# Patient Record
Sex: Male | Born: 1960 | Race: Black or African American | Hispanic: No | Smoking: Former smoker
Health system: Southern US, Community
[De-identification: ages and names within clinical notes are randomized; demographics above are authoritative.]

## PROBLEM LIST (undated history)

## (undated) DIAGNOSIS — I10 Essential (primary) hypertension: Secondary | ICD-10-CM

## (undated) DIAGNOSIS — T7840XA Allergy, unspecified, initial encounter: Secondary | ICD-10-CM

## (undated) HISTORY — DX: Essential (primary) hypertension: I10

## (undated) HISTORY — DX: Allergy, unspecified, initial encounter: T78.40XA

## (undated) HISTORY — PX: INCISION AND DRAINAGE PERITONSILLAR ABSCESS: SUR670

## (undated) HISTORY — PX: KNEE ARTHROSCOPY: SHX127

---

## 2010-03-02 ENCOUNTER — Ambulatory Visit: Payer: Self-pay | Admitting: Gastroenterology

## 2010-05-23 ENCOUNTER — Emergency Department: Payer: Self-pay | Admitting: Emergency Medicine

## 2010-06-23 ENCOUNTER — Ambulatory Visit: Payer: Self-pay | Admitting: Unknown Physician Specialty

## 2010-07-02 ENCOUNTER — Ambulatory Visit: Payer: Self-pay | Admitting: Unknown Physician Specialty

## 2012-08-06 ENCOUNTER — Observation Stay: Payer: Self-pay | Admitting: Otolaryngology

## 2012-08-06 LAB — CBC WITH DIFFERENTIAL/PLATELET
Comment - H1-Com1: NORMAL
Comment - H1-Com2: NORMAL
HCT: 48.7 % (ref 40.0–52.0)
HGB: 15.8 g/dL (ref 13.0–18.0)
Lymphocytes: 15 %
MCHC: 32.5 g/dL (ref 32.0–36.0)
Monocytes: 6 %
RBC: 5.62 10*6/uL (ref 4.40–5.90)
RDW: 14.1 % (ref 11.5–14.5)
WBC: 17.4 10*3/uL — ABNORMAL HIGH (ref 3.8–10.6)

## 2012-08-06 LAB — BASIC METABOLIC PANEL
Anion Gap: 6 — ABNORMAL LOW (ref 7–16)
BUN: 9 mg/dL (ref 7–18)
Calcium, Total: 9.4 mg/dL (ref 8.5–10.1)
EGFR (African American): >60
EGFR (Non-African Amer.): >60
Glucose: 100 mg/dL — ABNORMAL HIGH (ref 65–99)
Osmolality: 271 (ref 275–301)

## 2012-08-13 LAB — WOUND CULTURE

## 2015-04-08 NOTE — Op Note (Signed)
PATIENT NAME:  Terry Pearson, Terry Pearson MR#:  [id number] DATE OF BIRTH:  04/05/1961  DATE OF PROCEDURE:  08/06/2012  PREOPERATIVE DIAGNOSIS: Right peritonsillar abscess.   POSTOPERATIVE DIAGNOSIS: Right peritonsillar abscess.   OPERATIVE PROCEDURE: Incision and drainage right peritonsillar abscess.   SURGEON: Cammy CopaPaul H. Jackey Housey, MD  ANESTHESIA: General.   COMPLICATIONS: None.   TOTAL ESTIMATED BLOOD LOSS: 30 mL.   DESCRIPTION OF PROCEDURE: Patient given general anesthesia by oral endotracheal intubation. A Davis mouth gag was used to visualize the oropharynx. The uvula had watery edema and was pushed over to the left side. The left tonsil was enlarged. There is a little bit of scab superiorly on the tonsil and when we removed this you could see some pus that was slowly starting to leak through this area. An incision was made in the palate superior and laterally to the tonsil. This was cut with cautery. Hemostat was used to open up the peritonsillar area and a superior abscess was found. This was all suctioned clear. It was first cultured for aerobic and anaerobic bacteria. A piece of gauze was then stuffed in the hole. This helped to control bleeding. There is no significant oozing. The patient tolerated procedure well. He was awakened, taken to the recovery room in satisfactory condition. There were no operative complications.   ____________________________ Cammy CopaPaul H. Gustav Knueppel, MD phj:cms D: 08/06/2012 19:24:36 ET T: 08/07/2012 09:26:19 ET JOB#: [id number]  cc: Cammy CopaPaul H. Vineeth Fell, MD, <Dictator> Cammy CopaPAUL H Frankye Schwegel MD ELECTRONICALLY SIGNED 08/10/2012 8:28

## 2015-07-08 ENCOUNTER — Encounter: Payer: Self-pay | Admitting: Family Medicine

## 2015-07-08 ENCOUNTER — Ambulatory Visit (INDEPENDENT_AMBULATORY_CARE_PROVIDER_SITE_OTHER): Payer: Managed Care, Other (non HMO) | Admitting: Family Medicine

## 2015-07-08 VITALS — BP 138/72 | HR 87 | Temp 97.6°F | Resp 18 | Ht 72.0 in | Wt 191.5 lb

## 2015-07-08 DIAGNOSIS — M5416 Radiculopathy, lumbar region: Secondary | ICD-10-CM

## 2015-07-08 DIAGNOSIS — IMO0001 Reserved for inherently not codable concepts without codable children: Secondary | ICD-10-CM

## 2015-07-08 DIAGNOSIS — M5442 Lumbago with sciatica, left side: Secondary | ICD-10-CM

## 2015-07-08 DIAGNOSIS — G8929 Other chronic pain: Secondary | ICD-10-CM | POA: Insufficient documentation

## 2015-07-08 MED ORDER — TIZANIDINE HCL 2 MG PO TABS: 2.0000 mg | ORAL_TABLET | Freq: Three times a day (TID) | ORAL | Status: DC | PRN

## 2015-07-08 MED ORDER — NAPROXEN 500 MG PO TABS: 500.0000 mg | ORAL_TABLET | Freq: Two times a day (BID) | ORAL | Status: DC

## 2015-07-08 NOTE — Progress Notes (Signed)
Name: Terry Pearson   MRN: [id number]    DOB: 12-Jan-1961   Date:07/08/2015       Progress Note  Subjective  Chief Complaint  Chief Complaint  Patient presents with  . Establish Care    NP (Dr. Elsie StainMoffeit)  . Back Pain    Lower     Back Pain This is a recurrent problem. The current episode started 1 to 4 weeks ago. The problem occurs constantly. The problem has been gradually worsening since onset. The pain is present in the lumbar spine and gluteal. The pain radiates to the right knee and right thigh. The pain is at a severity of 7/10. The pain is moderate. The symptoms are aggravated by position and sitting (worse with sitting continuously for driving. 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 Stiffness is present in the morning and all day. Associated symptoms include leg pain and tingling (tingling in his R. thigh sometimes.). Pertinent negatives include no abdominal pain, bladder incontinence, bowel incontinence, chest pain, dysuria, numbness, paresis or paresthesias. He has tried NSAIDs and muscle relaxant for the symptoms. The treatment provided moderate relief.      Past Medical History  Diagnosis Date  . Allergy     Past Surgical History  Procedure Laterality Date  . Knee surgery Left 2011  . Throat surgery  2013    to remove a cyst    Family History  Problem Relation Age of Onset  . Hypertension Mother   . Cancer Mother     breast  . Hypertension Father   . Diabetes Father   . Cancer Sister     Pancreatic Cancer.    History   Social History  . Marital Status: Married    Spouse Name: N/A  . Number of Children: N/A  . Years of Education: N/A   Occupational History  . Not on file.   Social History Main Topics  . Smoking status: Former Smoker -- 13 years  . Smokeless tobacco: Never Used  . Alcohol Use: 0.6 oz/week    1 Standard drinks or equivalent per week  . Drug Use: No  . Sexual Activity: Not on file   Other Topics Concern  . Not on file   Social  History Narrative  . No narrative on file     Current outpatient prescriptions:  Marland Kitchen.  MULTIPLE VITAMIN PO, Take 1 tablet by mouth daily., Disp: , Rfl:   No Known Allergies   Review of Systems  Cardiovascular: Negative for chest pain.  Gastrointestinal: Negative for abdominal pain and bowel incontinence.  Genitourinary: Negative for bladder incontinence and dysuria.  Musculoskeletal: Positive for back pain.  Neurological: Positive for tingling (tingling in his R. thigh sometimes.). Negative for numbness and paresthesias.      Objective  Filed Vitals:   07/08/15 1151  BP: 138/72  Pulse: 87  Temp: 97.6 F (36.4 C)  TempSrc: Oral  Resp: 18  Height: 6' (1.829 m)  Weight: 191 lb 8 oz (86.864 kg)  SpO2: 94%    Physical Exam  Constitutional: He is well-developed, well-nourished, and in no distress.  Musculoskeletal:       Lumbar back: He exhibits decreased range of motion, tenderness, pain and spasm.       Back:  Neurological: He has an abnormal Straight Leg Raise Test (pain in lower back and posterior R leg.).  Reflex Scores:      Patellar reflexes are 2+ on the right side. Nursing note and vitals reviewed.  Assessment & Plan 1. Radicular pain of right lower back Started patient on muscle relaxer and NSAID therapy and we'll order an MRI of lumbar spine for evaluation of progressive lumbar radiculopathy. - naproxen (NAPROSYN) 500 MG tablet; Take 1 tablet (500 mg total) by mouth 2 (two) times daily with a meal.  Dispense: 30 tablet; Refill: 0 - tiZANidine (ZANAFLEX) 2 MG tablet; Take 1 tablet (2 mg total) by mouth every 8 (eight) hours as needed for muscle spasms.  Dispense: 30 tablet; Refill: 0 - MR Lumbar Spine Wo Contrast; Future    Deirdra Heumann Asad A[facility] 07/08/2015 12:13 PM

## 2015-07-15 ENCOUNTER — Ambulatory Visit: Payer: Self-pay | Admitting: Family Medicine

## 2015-07-23 ENCOUNTER — Ambulatory Visit: Payer: Managed Care, Other (non HMO) | Admitting: Family Medicine

## 2015-10-21 ENCOUNTER — Ambulatory Visit (INDEPENDENT_AMBULATORY_CARE_PROVIDER_SITE_OTHER): Payer: Managed Care, Other (non HMO) | Admitting: Family Medicine

## 2015-10-21 ENCOUNTER — Encounter: Payer: Self-pay | Admitting: Family Medicine

## 2015-10-21 VITALS — BP 164/110 | HR 91 | Temp 97.6°F | Resp 16 | Ht 72.0 in | Wt 188.9 lb

## 2015-10-21 DIAGNOSIS — I10 Essential (primary) hypertension: Secondary | ICD-10-CM | POA: Diagnosis not present

## 2015-10-21 DIAGNOSIS — M545 Low back pain, unspecified: Secondary | ICD-10-CM

## 2015-10-21 DIAGNOSIS — G8929 Other chronic pain: Secondary | ICD-10-CM | POA: Diagnosis not present

## 2015-10-21 MED ORDER — TRAMADOL HCL 50 MG PO TABS: 50.0000 mg | ORAL_TABLET | Freq: Four times a day (QID) | ORAL | Status: DC | PRN

## 2015-10-21 MED ORDER — TIZANIDINE HCL 2 MG PO TABS: 2.0000 mg | ORAL_TABLET | Freq: Three times a day (TID) | ORAL | Status: DC | PRN

## 2015-10-21 MED ORDER — LISINOPRIL-HYDROCHLOROTHIAZIDE 10-12.5 MG PO TABS: 1.0000 | ORAL_TABLET | Freq: Every day | ORAL | Status: DC

## 2015-10-21 NOTE — Progress Notes (Signed)
Name: Terry Pearson   MRN: [id number]    DOB: 05-Feb-1961   Date:10/21/2015       Progress Note  Subjective  Chief Complaint  Chief Complaint  Patient presents with  . Back Pain    low back pain    Back Pain This is a recurrent problem. The problem is unchanged. The pain is present in the lumbar spine. The quality of the pain is described as aching. The pain does not radiate. The pain is at a severity of 5/10. The pain is mild. The symptoms are aggravated by sitting and standing (standing for long periods.). Pertinent negatives include no bladder incontinence, bowel incontinence, chest pain, fever, headaches, numbness, tingling or weakness. He has tried NSAIDs and muscle relaxant (Taking Naproxena dn Tizanidine Rx at last OV intermittently.) for the symptoms.    Past Medical History  Diagnosis Date  . Allergy   . Hypertension, poor control 10/21/2015    Past Surgical History  Procedure Laterality Date  . Knee surgery Left 2011  . Throat surgery  2013    to remove a cyst    Family History  Problem Relation Age of Onset  . Hypertension Mother   . Cancer Mother     breast  . Hypertension Father   . Diabetes Father   . Cancer Sister     Pancreatic Cancer.    Social History   Social History  . Marital Status: Married    Spouse Name: N/A  . Number of Children: N/A  . Years of Education: N/A   Occupational History  . Not on file.   Social History Main Topics  . Smoking status: Former Smoker -- 13 years  . Smokeless tobacco: Never Used  . Alcohol Use: 0.6 oz/week    1 Standard drinks or equivalent per week  . Drug Use: No  . Sexual Activity: Not on file   Other Topics Concern  . Not on file   Social History Narrative    Current outpatient prescriptions:  .  lisinopril-hydrochlorothiazide (PRINZIDE,ZESTORETIC) 10-12.5 MG tablet, Take 1 tablet by mouth daily., Disp: 90 tablet, Rfl: 0 .  MULTIPLE VITAMIN PO, Take 1 tablet by mouth daily., Disp: , Rfl:  .   tiZANidine (ZANAFLEX) 2 MG tablet, Take 1 tablet (2 mg total) by mouth every 8 (eight) hours as needed for muscle spasms., Disp: 30 tablet, Rfl: 0 .  traMADol (ULTRAM) 50 MG tablet, Take 1 tablet (50 mg total) by mouth every 6 (six) hours as needed., Disp: 60 tablet, Rfl: 0  Allergies  Allergen Reactions  . Tramadol Other (See Comments)    Made him feel like he was drunk.    Review of Systems  Constitutional: Negative for fever and chills.  Eyes: Negative for blurred vision.  Respiratory: Negative for shortness of breath.   Cardiovascular: Negative for chest pain.  Gastrointestinal: Negative for bowel incontinence.  Genitourinary: Negative for bladder incontinence.  Musculoskeletal: Positive for back pain.  Neurological: Negative for tingling, weakness, numbness and headaches.   Objective  Filed Vitals:   10/21/15 1357 10/21/15 1436  BP: 164/100 164/110  Pulse: 91   Temp: 97.6 F (36.4 C)   TempSrc: Oral   Resp: 16   Height: 6' (1.829 m)   Weight: 188 lb 14.4 oz (85.684 kg)   SpO2: 95%     Physical Exam  Constitutional: He is oriented to person, place, and time and well-developed, well-nourished, and in no distress.  Cardiovascular: Normal rate, regular rhythm and normal  heart sounds.   No murmur heard. Pulmonary/Chest: Effort normal and breath sounds normal. He has no wheezes. He has no rales.  Musculoskeletal:       Lumbar back: He exhibits pain and spasm. He exhibits normal range of motion and no tenderness.       Back:  Neurological: He is alert and oriented to person, place, and time.  Nursing note and vitals reviewed.   Assessment & Plan  1. Chronic bilateral low back pain without sciatica He has taken naproxen in the past. We will start on tramadol and muscle relaxant therapy and we'll obtain an x-ray for evaluation. - tiZANidine (ZANAFLEX) 2 MG tablet; Take 1 tablet (2 mg total) by mouth every 8 (eight) hours as needed for muscle spasms.  Dispense: 30  tablet; Refill: 0 - traMADol (ULTRAM) 50 MG tablet; Take 1 tablet (50 mg total) by mouth every 6 (six) hours as needed.  Dispense: 60 tablet; Refill: 0 - DG Lumbar Spine Complete; Future  2. Hypertension, poor control Or pressure elevated on manual repeat. We'll start on lisinopril-HCTZ. Patient will follow-up in 4 weeks for repeat assessment - lisinopril-hydrochlorothiazide (PRINZIDE,ZESTORETIC) 10-12.5 MG tablet; Take 1 tablet by mouth daily.  Dispense: 90 tablet; Refill: 0    Terry Pearson [facility] 10/21/2015 7:29 PM

## 2015-11-07 ENCOUNTER — Ambulatory Visit: Payer: Managed Care, Other (non HMO) | Admitting: Family Medicine

## 2015-11-10 ENCOUNTER — Ambulatory Visit (INDEPENDENT_AMBULATORY_CARE_PROVIDER_SITE_OTHER): Payer: Managed Care, Other (non HMO) | Admitting: Family Medicine

## 2015-11-10 ENCOUNTER — Encounter: Payer: Self-pay | Admitting: Family Medicine

## 2015-11-10 VITALS — BP 162/89 | HR 104 | Temp 98.7°F | Resp 18 | Ht 72.0 in | Wt 186.2 lb

## 2015-11-10 DIAGNOSIS — I10 Essential (primary) hypertension: Secondary | ICD-10-CM | POA: Diagnosis not present

## 2015-11-10 MED ORDER — LISINOPRIL-HYDROCHLOROTHIAZIDE 20-25 MG PO TABS: 1.0000 | ORAL_TABLET | Freq: Every day | ORAL | Status: DC

## 2015-11-10 NOTE — Progress Notes (Signed)
Name: Terry Pearson   MRN: [id number]    DOB: 1961/10/19   Date:11/10/2015       Progress Note  Subjective  Chief Complaint  Chief Complaint  Patient presents with  . Follow-up    2 wk back pain  . Hypertension    Hypertension This is a chronic problem. The problem is unchanged. Pertinent negatives include no blurred vision, chest pain, headaches, palpitations or shortness of breath. Past treatments include ACE inhibitors and diuretics. There is no history of kidney disease, CAD/MI or CVA.    Past Medical History  Diagnosis Date  . Allergy   . Hypertension, poor control 10/21/2015    Past Surgical History  Procedure Laterality Date  . Knee surgery Left 2011  . Throat surgery  2013    to remove a cyst    Family History  Problem Relation Age of Onset  . Hypertension Mother   . Cancer Mother     breast  . Hypertension Father   . Diabetes Father   . Cancer Sister     Pancreatic Cancer.    Social History   Social History  . Marital Status: Married    Spouse Name: N/A  . Number of Children: N/A  . Years of Education: N/A   Occupational History  . Not on file.   Social History Main Topics  . Smoking status: Former Smoker -- 13 years  . Smokeless tobacco: Never Used  . Alcohol Use: 0.6 oz/week    1 Standard drinks or equivalent per week  . Drug Use: No  . Sexual Activity: Not on file   Other Topics Concern  . Not on file   Social History Narrative     Current outpatient prescriptions:  .  lisinopril-hydrochlorothiazide (PRINZIDE,ZESTORETIC) 10-12.5 MG tablet, Take 1 tablet by mouth daily., Disp: 90 tablet, Rfl: 0 .  MULTIPLE VITAMIN PO, Take 1 tablet by mouth daily., Disp: , Rfl:  .  tiZANidine (ZANAFLEX) 2 MG tablet, Take 1 tablet (2 mg total) by mouth every 8 (eight) hours as needed for muscle spasms., Disp: 30 tablet, Rfl: 0 .  traMADol (ULTRAM) 50 MG tablet, Take 1 tablet (50 mg total) by mouth every 6 (six) hours as needed., Disp: 60 tablet, Rfl:  0  Allergies  Allergen Reactions  . Tramadol Other (See Comments)    Made him feel like he was drunk.     Review of Systems  Eyes: Negative for blurred vision.  Respiratory: Negative for shortness of breath.   Cardiovascular: Negative for chest pain and palpitations.  Neurological: Negative for headaches.    Objective  Filed Vitals:   11/10/15 1017  BP: 162/89  Pulse: 104  Temp: 98.7 F (37.1 C)  TempSrc: Oral  Resp: 18  Height: 6' (1.829 m)  Weight: 186 lb 3.2 oz (84.46 kg)  SpO2: 97%    Physical Exam  Constitutional: He is oriented to person, place, and time and well-developed, well-nourished, and in no distress.  Eyes: Conjunctivae are normal. Pupils are equal, round, and reactive to light.  Cardiovascular: Normal rate, regular rhythm and normal heart sounds.   No murmur heard. Pulmonary/Chest: Effort normal and breath sounds normal. He has no wheezes.  Musculoskeletal: He exhibits no edema.  Neurological: He is alert and oriented to person, place, and time.  Nursing note and vitals reviewed.   Assessment & Plan  1. Hypertension, poor control BP persistently elevated although much improved from 3 weeks ago. We will increase lisinopril HCTZ to 20-25  milligrams daily. Follow-up BP logs in one month. - lisinopril-hydrochlorothiazide (PRINZIDE,ZESTORETIC) 20-25 MG tablet; Take 1 tablet by mouth daily.  Dispense: 90 tablet; Refill: 0   Summar Mcglothlin Asad A. Faylene [facility]ah [facility] 11/10/2015 11:08 AM

## 2015-11-11 ENCOUNTER — Ambulatory Visit
Admission: RE | Admit: 2015-11-11 | Discharge: 2015-11-11 | Disposition: A | Discharge status: Home / Self Care | Payer: Managed Care, Other (non HMO) | Source: Ambulatory Visit | Attending: Surgical Oncology | Admitting: Surgical Oncology

## 2015-11-11 ENCOUNTER — Ambulatory Visit
Admission: RE | Admit: 2015-11-11 | Discharge: 2015-11-11 | Disposition: A | Discharge status: Home / Self Care | Payer: Managed Care, Other (non HMO) | Source: Ambulatory Visit | Attending: Family Medicine | Admitting: Family Medicine

## 2015-11-11 DIAGNOSIS — M545 Low back pain, unspecified: Secondary | ICD-10-CM

## 2015-11-11 DIAGNOSIS — M47896 Other spondylosis, lumbar region: Secondary | ICD-10-CM | POA: Diagnosis not present

## 2015-11-11 DIAGNOSIS — G8929 Other chronic pain: Secondary | ICD-10-CM | POA: Diagnosis not present

## 2015-11-11 DIAGNOSIS — I7090 Unspecified atherosclerosis: Secondary | ICD-10-CM | POA: Diagnosis not present

## 2015-12-01 ENCOUNTER — Telehealth: Payer: Self-pay | Admitting: Family Medicine

## 2015-12-01 NOTE — Telephone Encounter (Signed)
Routed to Dr. Shah for advice  

## 2015-12-01 NOTE — Telephone Encounter (Signed)
Pt would like a call back about a referral for his treatments for his back.

## 2015-12-01 NOTE — Telephone Encounter (Signed)
Returned call and left a voice message for patient 

## 2015-12-02 ENCOUNTER — Other Ambulatory Visit: Payer: Self-pay | Admitting: Family Medicine

## 2015-12-02 DIAGNOSIS — M545 Low back pain: Principal | ICD-10-CM

## 2015-12-02 DIAGNOSIS — G8929 Other chronic pain: Secondary | ICD-10-CM

## 2015-12-16 ENCOUNTER — Ambulatory Visit (INDEPENDENT_AMBULATORY_CARE_PROVIDER_SITE_OTHER): Payer: Managed Care, Other (non HMO) | Admitting: Family Medicine

## 2015-12-16 ENCOUNTER — Encounter: Payer: Self-pay | Admitting: Family Medicine

## 2015-12-16 VITALS — BP 148/78 | HR 87 | Temp 97.8°F | Resp 16 | Ht 72.0 in | Wt 192.1 lb

## 2015-12-16 DIAGNOSIS — G8929 Other chronic pain: Secondary | ICD-10-CM

## 2015-12-16 DIAGNOSIS — M5442 Lumbago with sciatica, left side: Secondary | ICD-10-CM

## 2015-12-16 DIAGNOSIS — I10 Essential (primary) hypertension: Secondary | ICD-10-CM

## 2015-12-16 NOTE — Progress Notes (Signed)
Name: Terry Pearson   MRN: [id number]    DOB: 08-Nov-1961   Date:12/16/2015       Progress Note  Subjective  Chief Complaint  Chief Complaint  Patient presents with  . Follow-up    1 mo  . Hypertension    Hypertension This is a chronic problem. The problem has been gradually improving since onset. The problem is uncontrolled. Pertinent negatives include no blurred vision, chest pain, headaches, palpitations or shortness of breath. Past treatments include ACE inhibitors and diuretics. There is no history of kidney disease, CAD/MI or CVA.  Back Pain This is a chronic problem. The pain is present in the lumbar spine. The pain radiates to the left thigh and left foot. The pain is at a severity of 4/10. The pain is moderate. The symptoms are aggravated by sitting and standing (worse with prolonged sitting/standing). Associated symptoms include numbness (left leg feels numb sometimes). Pertinent negatives include no bladder incontinence, bowel incontinence, chest pain, headaches or paresis. He has tried analgesics for the symptoms.    Past Medical History  Diagnosis Date  . Allergy   . Hypertension, poor control 10/21/2015    Past Surgical History  Procedure Laterality Date  . Knee surgery Left 2011  . Throat surgery  2013    to remove a cyst    Family History  Problem Relation Age of Onset  . Hypertension Mother   . Cancer Mother     breast  . Hypertension Father   . Diabetes Father   . Cancer Sister     Pancreatic Cancer.    Social History   Social History  . Marital Status: Married    Spouse Name: N/A  . Number of Children: N/A  . Years of Education: N/A   Occupational History  . Not on file.   Social History Main Topics  . Smoking status: Former Smoker -- 13 years  . Smokeless tobacco: Never Used  . Alcohol Use: 0.6 oz/week    1 Standard drinks or equivalent per week  . Drug Use: No  . Sexual Activity: Not on file   Other Topics Concern  . Not on file    Social History Narrative     Current outpatient prescriptions:  .  lisinopril-hydrochlorothiazide (PRINZIDE,ZESTORETIC) 20-25 MG tablet, Take 1 tablet by mouth daily., Disp: 90 tablet, Rfl: 0 .  MULTIPLE VITAMIN PO, Take 1 tablet by mouth daily., Disp: , Rfl:  .  tiZANidine (ZANAFLEX) 2 MG tablet, Take 1 tablet (2 mg total) by mouth every 8 (eight) hours as needed for muscle spasms., Disp: 30 tablet, Rfl: 0 .  traMADol (ULTRAM) 50 MG tablet, Take 1 tablet (50 mg total) by mouth every 6 (six) hours as needed., Disp: 60 tablet, Rfl: 0  Allergies  Allergen Reactions  . Tramadol Other (See Comments)    Made him feel like he was drunk.     Review of Systems  Eyes: Negative for blurred vision.  Respiratory: Negative for shortness of breath.   Cardiovascular: Negative for chest pain and palpitations.  Gastrointestinal: Negative for bowel incontinence.  Genitourinary: Negative for bladder incontinence.  Musculoskeletal: Positive for back pain. Negative for myalgias.  Neurological: Positive for numbness (left leg feels numb sometimes). Negative for headaches.     Objective  Filed Vitals:   12/16/15 0905  BP: 148/78  Pulse: 87  Temp: 97.8 F (36.6 C)  TempSrc: Oral  Resp: 16  Height: 6' (1.829 m)  Weight: 192 lb 1.6 oz (87.136 kg)  SpO2: 96%    Physical Exam  Constitutional: He is oriented to person, place, and time and well-developed, well-nourished, and in no distress.  HENT:  Head: Normocephalic and atraumatic.  Eyes: Conjunctivae are normal. Pupils are equal, round, and reactive to light.  Cardiovascular: Normal rate, regular rhythm and normal heart sounds.   No murmur heard. Pulmonary/Chest: Effort normal and breath sounds normal. He has no wheezes.  Musculoskeletal: He exhibits no edema.  Neurological: He is alert and oriented to person, place, and time.  Nursing note and vitals reviewed.   Assessment & Plan  1. Chronic left-sided low back pain with  left-sided sciatica Low back pain with radicular symptoms. We will refer to spine specialist for further evaluation. - Ambulatory referral to Neurosurgery  2. Essential hypertension BP gradually improving. Continue on present therapy and recheck in 3 months. - Comprehensive Metabolic Panel (CMET)   Kosha Jaquith Asad A. Faylene KurtzShah [facility] 12/16/2015 9:24 AM

## 2015-12-17 LAB — COMPREHENSIVE METABOLIC PANEL
A/G RATIO: 1.4 (ref 1.1–2.5)
ALK PHOS: 77 IU/L (ref 39–117)
ALT: 23 IU/L (ref 0–44)
AST: 20 IU/L (ref 0–40)
Albumin: 4.2 g/dL (ref 3.5–5.5)
BILIRUBIN TOTAL: 0.3 mg/dL (ref 0.0–1.2)
BUN / CREAT RATIO: 13 (ref 9–20)
BUN: 12 mg/dL (ref 6–24)
CHLORIDE: 97 mmol/L (ref 96–106)
CO2: 25 mmol/L (ref 18–29)
Calcium: 9.5 mg/dL (ref 8.7–10.2)
Creatinine, Ser: 0.94 mg/dL (ref 0.76–1.27)
GFR calc Af Amer: 104 mL/min/{1.73_m2} (ref >59)
GFR calc non Af Amer: 90 mL/min/{1.73_m2} (ref >59)
GLUCOSE: 105 mg/dL — AB (ref 65–99)
Globulin, Total: 3 g/dL (ref 1.5–4.5)
POTASSIUM: 4 mmol/L (ref 3.5–5.2)
Sodium: 139 mmol/L (ref 134–144)
Total Protein: 7.2 g/dL (ref 6.0–8.5)

## 2016-01-02 NOTE — Telephone Encounter (Signed)
ERRENOUS °

## 2016-01-27 ENCOUNTER — Other Ambulatory Visit: Payer: Self-pay | Admitting: Family Medicine

## 2016-01-27 ENCOUNTER — Ambulatory Visit: Payer: Managed Care, Other (non HMO) | Admitting: Family Medicine

## 2017-02-04 ENCOUNTER — Ambulatory Visit (INDEPENDENT_AMBULATORY_CARE_PROVIDER_SITE_OTHER): Payer: 59 | Admitting: Family Medicine

## 2017-02-04 ENCOUNTER — Encounter: Payer: Self-pay | Admitting: Family Medicine

## 2017-02-04 DIAGNOSIS — I10 Essential (primary) hypertension: Secondary | ICD-10-CM | POA: Diagnosis not present

## 2017-02-04 MED ORDER — LISINOPRIL-HYDROCHLOROTHIAZIDE 20-25 MG PO TABS: 1.0000 | ORAL_TABLET | Freq: Every day | ORAL | 1 refills | Status: DC

## 2017-02-04 NOTE — Progress Notes (Signed)
Name: Terry Pearson   MRN: [id number]    DOB: 07-Sep-1961   Date:02/04/2017       Progress Note  Subjective  Chief Complaint  Chief Complaint  Patient presents with  . Medication Refill    Hypertension  This is a chronic problem. The problem is unchanged. Pertinent negatives include no blurred vision, chest pain, headaches, palpitations or shortness of breath. Past treatments include ACE inhibitors and diuretics. There is no history of kidney disease, CAD/MI or CVA.    Past Medical History:  Diagnosis Date  . Allergy   . Hypertension, poor control 10/21/2015    Past Surgical History:  Procedure Laterality Date  . KNEE SURGERY Left 2011  . THROAT SURGERY  2013   to remove a cyst    Family History  Problem Relation Age of Onset  . Hypertension Mother   . Cancer Mother     breast  . Hypertension Father   . Diabetes Father   . Cancer Sister     Pancreatic Cancer.    Social History   Social History  . Marital status: Married    Spouse name: N/A  . Number of children: N/A  . Years of education: N/A   Occupational History  . Not on file.   Social History Main Topics  . Smoking status: Former Smoker    Years: 13.00  . Smokeless tobacco: Never Used  . Alcohol use 0.6 oz/week    1 Standard drinks or equivalent per week  . Drug use: No  . Sexual activity: Not on file   Other Topics Concern  . Not on file   Social History Narrative  . No narrative on file     Current Outpatient Prescriptions:  .  lisinopril-hydrochlorothiazide (PRINZIDE,ZESTORETIC) 20-25 MG tablet, Take 1 tablet by mouth daily., Disp: 90 tablet, Rfl: 0 .  MULTIPLE VITAMIN PO, Take 1 tablet by mouth daily., Disp: , Rfl:  .  tiZANidine (ZANAFLEX) 2 MG tablet, Take 1 tablet (2 mg total) by mouth every 8 (eight) hours as needed for muscle spasms. (Patient not taking: Reported on 02/04/2017), Disp: 30 tablet, Rfl: 0 .  traMADol (ULTRAM) 50 MG tablet, Take 1 tablet (50 mg total) by mouth every 6  (six) hours as needed. (Patient not taking: Reported on 02/04/2017), Disp: 60 tablet, Rfl: 0  Allergies  Allergen Reactions  . Tramadol Other (See Comments)    Made him feel like he was drunk.     Review of Systems  Eyes: Negative for blurred vision.  Respiratory: Negative for shortness of breath.   Cardiovascular: Negative for chest pain and palpitations.  Neurological: Negative for headaches.     Objective  Vitals:   02/04/17 1438  BP: 139/74  Pulse: 95  Resp: 17  Temp: 98.8 F (37.1 C)  TempSrc: Oral  SpO2: 96%  Weight: 188 lb 3.2 oz (85.4 kg)  Height: 6' (1.829 m)    Physical Exam  Constitutional: He is oriented to person, place, and time and well-developed, well-nourished, and in no distress.  Cardiovascular: Normal rate, regular rhythm and normal heart sounds.   No murmur heard. Pulmonary/Chest: Effort normal and breath sounds normal.  Neurological: He is alert and oriented to person, place, and time.  Psychiatric: Mood, memory, affect and judgment normal.  Nursing note and vitals reviewed.       Assessment & Plan  1. Essential hypertension BP stable on present pharmacotherapy, refills provided - lisinopril-hydrochlorothiazide (PRINZIDE,ZESTORETIC) 20-25 MG tablet; Take 1 tablet by mouth  daily.  Dispense: 90 tablet; Refill: 1   Wladyslawa Disbro Asad [facility] 02/04/2017 2:51 PM

## 2017-04-11 ENCOUNTER — Encounter: Payer: 59 | Admitting: Family Medicine

## 2017-07-26 ENCOUNTER — Other Ambulatory Visit: Payer: Self-pay | Admitting: Family Medicine

## 2017-07-26 DIAGNOSIS — I10 Essential (primary) hypertension: Secondary | ICD-10-CM

## 2017-10-21 IMAGING — CR DG LUMBAR SPINE COMPLETE 4+V
5 series · 5 of 5 positions shown · non-contrast
Comparison: None.

CLINICAL DATA: Chronic back pain. Worsening over last 8 months.
Radiation down lower extremities.

EXAM:
LUMBAR SPINE - COMPLETE 4+ VIEW

[l-spine ap]
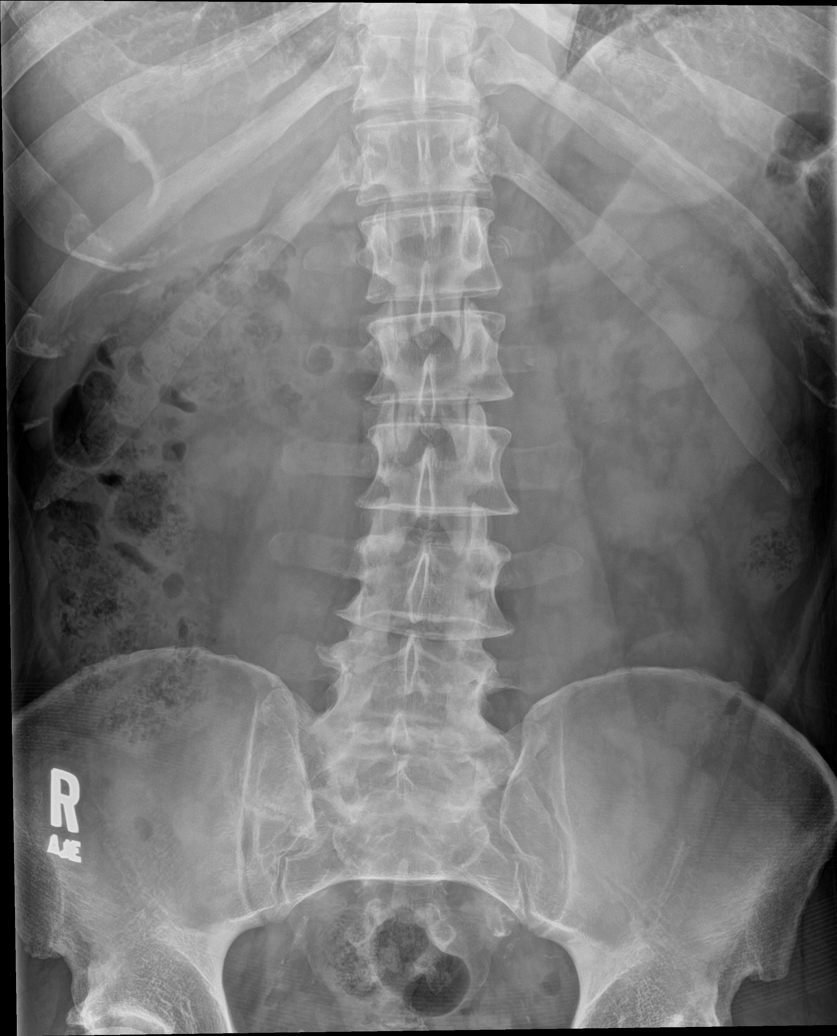

[l-spine obl (1 of 2)]
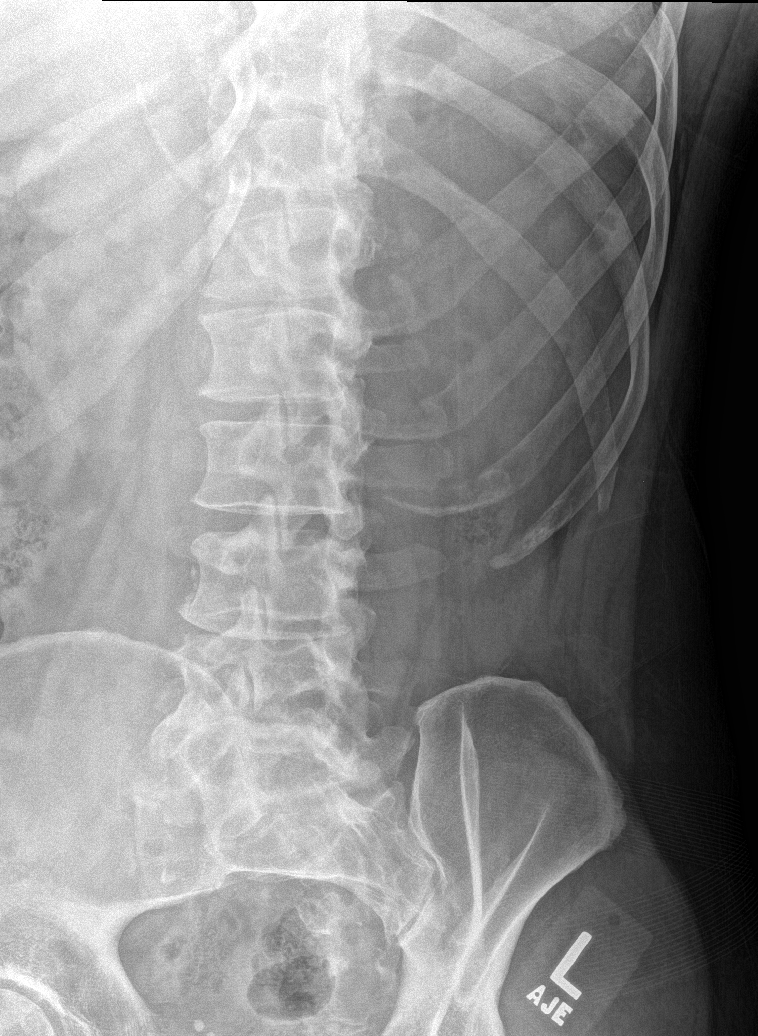

[l-spine obl (2 of 2)]
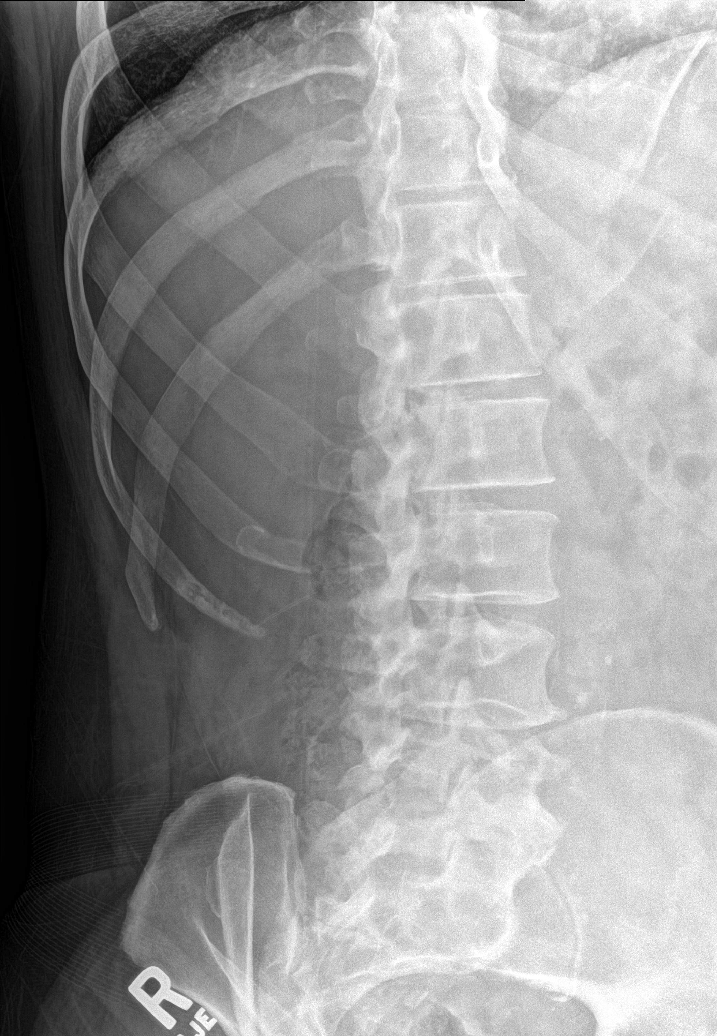

[l-spine lat]
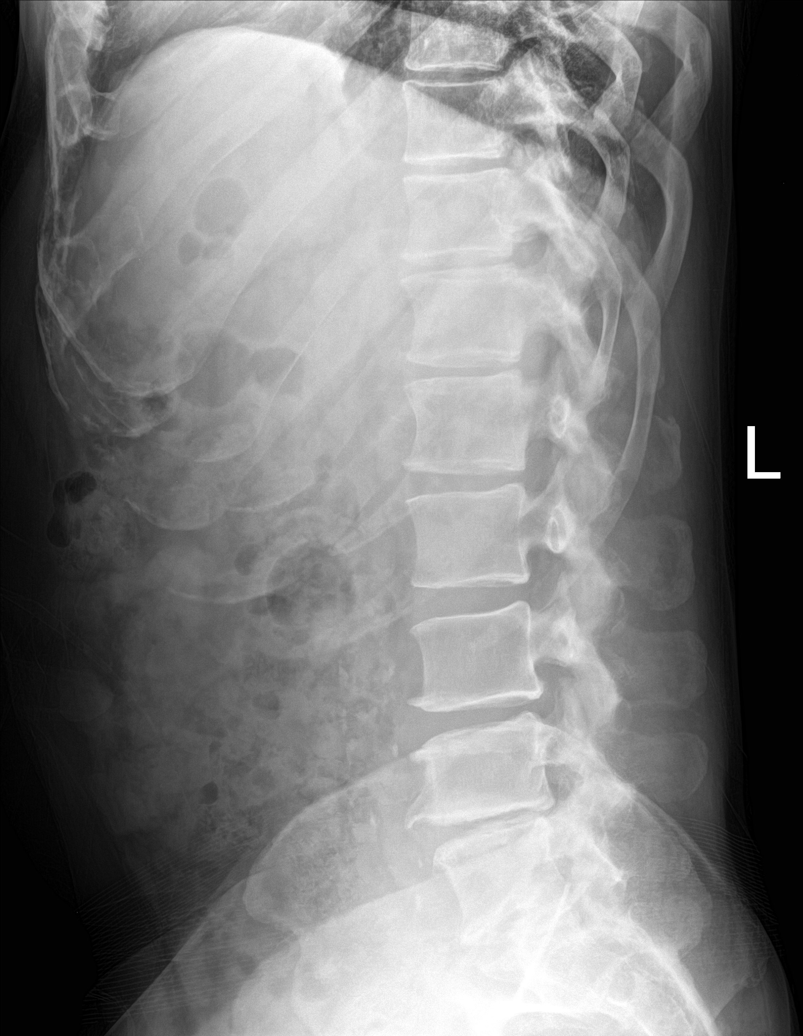

[l-spine spot]
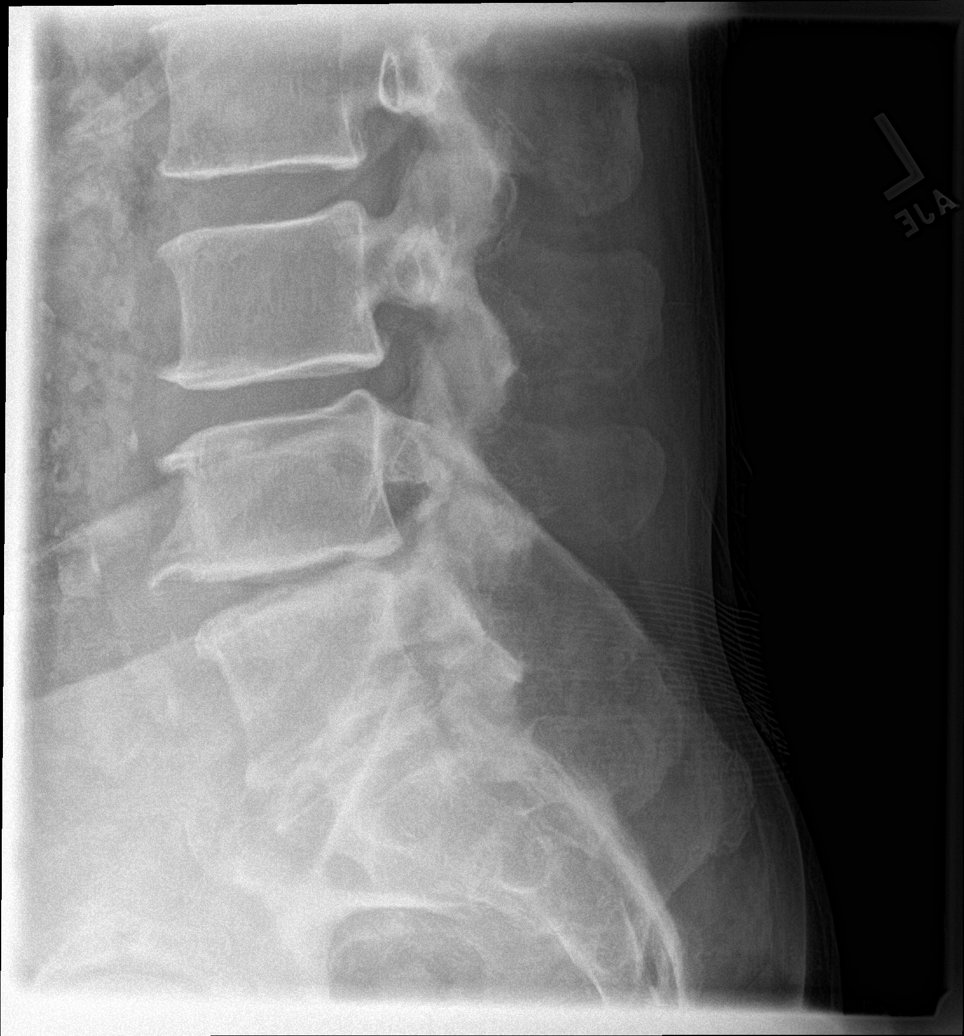

[5 of 5 positions shown; findings below may reference images not displayed]

FINDINGS: Degenerative changes lumbar spine with scoliosis concave right. No
acute abnormality identified. No evidence of fracture or
dislocation. Aortoiliac and visceral atherosclerotic vascular
disease.
IMPRESSION: 1. Multilevel degenerative changes lumbar spine, no acute
abnormality.

2.  Atherosclerotic vascular disease.

## 2018-02-17 ENCOUNTER — Ambulatory Visit (INDEPENDENT_AMBULATORY_CARE_PROVIDER_SITE_OTHER): Payer: BLUE CROSS/BLUE SHIELD | Admitting: Family Medicine

## 2018-02-17 ENCOUNTER — Encounter: Payer: Self-pay | Admitting: Family Medicine

## 2018-02-17 VITALS — BP 176/122 | HR 83 | Temp 98.1°F | Resp 16 | Ht 72.0 in | Wt 197.2 lb

## 2018-02-17 DIAGNOSIS — Z131 Encounter for screening for diabetes mellitus: Secondary | ICD-10-CM | POA: Diagnosis not present

## 2018-02-17 DIAGNOSIS — Z125 Encounter for screening for malignant neoplasm of prostate: Secondary | ICD-10-CM | POA: Diagnosis not present

## 2018-02-17 DIAGNOSIS — Z13220 Encounter for screening for lipoid disorders: Secondary | ICD-10-CM | POA: Diagnosis not present

## 2018-02-17 DIAGNOSIS — Z113 Encounter for screening for infections with a predominantly sexual mode of transmission: Secondary | ICD-10-CM

## 2018-02-17 DIAGNOSIS — I10 Essential (primary) hypertension: Secondary | ICD-10-CM | POA: Diagnosis not present

## 2018-02-17 DIAGNOSIS — Z23 Encounter for immunization: Secondary | ICD-10-CM

## 2018-02-17 DIAGNOSIS — Z1211 Encounter for screening for malignant neoplasm of colon: Secondary | ICD-10-CM

## 2018-02-17 DIAGNOSIS — R1904 Left lower quadrant abdominal swelling, mass and lump: Secondary | ICD-10-CM

## 2018-02-17 DIAGNOSIS — Z1159 Encounter for screening for other viral diseases: Secondary | ICD-10-CM | POA: Diagnosis not present

## 2018-02-17 MED ORDER — ASPIRIN EC 81 MG PO TBEC: 81.0000 mg | DELAYED_RELEASE_TABLET | Freq: Every day | ORAL | 0 refills | Status: AC

## 2018-02-17 MED ORDER — LISINOPRIL-HYDROCHLOROTHIAZIDE 20-25 MG PO TABS: 1.0000 | ORAL_TABLET | Freq: Every day | ORAL | 0 refills | Status: DC

## 2018-02-17 MED ORDER — AMLODIPINE BESYLATE 2.5 MG PO TABS: 2.5000 mg | ORAL_TABLET | Freq: Every day | ORAL | 0 refills | Status: DC

## 2018-02-17 NOTE — Progress Notes (Signed)
Name: Terry Pearson   MRN: [id number]    DOB: Jul 06, 1961   Date:02/17/2018       Progress Note  Subjective  Chief Complaint  Chief Complaint  Patient presents with  . Hypertension    Has been without medication for 1 week-Denies any symptoms  . Lump on side    Onset-1 and half month, was moving heavy equipment on the farm and feels like he pulled something-felt a knot that kept getting bigger-went to Urgent Care where they did a [place].    HPI  HTN: he lost to follow up with Dr. Sherryll Burger. His last visit was one year ago . His bp at the time was at goal, he has been out of lisinopril/hctz for months now. 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 He is not sure if he had an injuries, however noticed a lump on left lower quadrant about 2.5 weeks ago, it got slightly larger and painful while working in [place]. He went to [facility] 02/06/2018 in [place] and had an US done that showed a 2.8 cmX1.9 cm X 6mm that could be a focal hematoma versus lymph node. He states not longer painful, he thinks it is getting smaller. No fever or chills. Change in bowel movements. Positive family history for prostate cancer - grandfather but diagnosed in his 58's. He denies any prostate symptoms at this time  Patient Active Problem List   Diagnosis Date Noted  . Hypertension 10/21/2015  . Chronic low back pain with left-sided sciatica 07/08/2015    Past Surgical History:  Procedure Laterality Date  . INCISION AND DRAINAGE PERITONSILLAR ABSCESS    . KNEE ARTHROSCOPY Left     Family History  Problem Relation Age of Onset  . Hypertension Mother   . Cancer Mother        breast  . Hypertension Father   . Diabetes Father   . Cancer Sister        Pancreatic Cancer  . Leukemia Maternal Grandfather   . Prostate cancer Paternal  Grandfather     Social History   Socioeconomic History  . Marital status: Divorced    Spouse name: Not on file  . Number of children: 3  . Years of education: Not on file  . Highest education level: 12th grade  Social Needs  . Financial resource strain: Not hard at all  . Food insecurity - worry: Never true  . Food insecurity - inability: Never true  . Transportation needs - medical: No  . Transportation needs - non-medical: No  Occupational History  . Occupation: Emergency planning/management officer   Tobacco Use  . Smoking status: Former Smoker    Packs/day: 0.25    Years: 4.00    Pack years: 1.00    Types: Cigarettes    Start date: 12/20/1998    Last attempt to quit: 02/18/2003    Years since quitting: 15.0  . Smokeless tobacco: Never Used  Substance and Sexual Activity  . Alcohol use: Yes    Alcohol/week: 0.6 oz    Types: 1 Standard drinks or equivalent per week  . Drug use: No  . Sexual activity: Yes    Partners: Female    Birth control/protection: Other-see comments  Other Topics Concern  . Not on file  Social History Narrative   Divorced,  3 children    Works out of town frequently and sometimes he can be gone for months     Current Outpatient Medications:  .  lisinopril-hydrochlorothiazide (PRINZIDE,ZESTORETIC) 20-25 MG tablet, Take 1 tablet by mouth daily., Disp: 30 tablet, Rfl: 0 .  MULTIPLE VITAMIN PO, Take 1 tablet by mouth daily., Disp: , Rfl:  .  amLODipine (NORVASC) 2.5 MG tablet, Take 1 tablet (2.5 mg total) by mouth daily., Disp: 30 tablet, Rfl: 0  Allergies  Allergen Reactions  . Tramadol Other (See Comments)    Made him feel like he was drunk.     ROS  Constitutional: Negative for fever , positive weight change.  Respiratory: Negative for cough and shortness of breath.   Cardiovascular: Negative for chest pain or palpitations.  Gastrointestinal: Negative for abdominal pain, no bowel changes.  Musculoskeletal: Negative for gait problem or joint swelling.  Skin:  Negative for rash.  Neurological: Negative for dizziness or headache.  No other specific complaints in a complete review of systems (except as listed in HPI above).  Objective  Vitals:   02/17/18 0956  BP: (!) 184/118  Pulse: 83  Resp: 16  Temp: 98.1 F (36.7 C)  TempSrc: Oral  SpO2: 95%  Weight: 197 lb 3.2 oz (89.4 kg)  Height: 6' (1.829 m)    Body mass index is 26.75 kg/m.  Physical Exam  Constitutional: Patient appears well-developed and well-nourished. No distress.  HEENT: head atraumatic, normocephalic, pupils equal and reactive to light,, neck supple, throat within normal limits Cardiovascular: Normal rate, regular rhythm and normal heart sounds.  No murmur heard. No BLE edema. Pulmonary/Chest: Effort normal and breath sounds normal. No respiratory distress. Abdominal: Soft.  There is no tenderness. He has a hard nodule on left lower quadrant, non-tender, skin intact, per patient smaller in size - advised to return for follow up, may need biopsy or repeat US next visit Psychiatric: Patient has a normal mood and affect. behavior is normal. Judgment and thought content normal.  PHQ2/9: Depression screen [facility] 2/9 02/17/2018 02/04/2017 12/16/2015 11/10/2015 10/21/2015  Decreased Interest 0 0 0 0 0  Down, Depressed, Hopeless 0 0 0 0 0  PHQ - 2 Score 0 0 0 0 0     Fall Risk: Fall Risk  02/17/2018 02/04/2017 12/16/2015 11/10/2015 10/21/2015  Falls in the past year? No No No No No     Functional Status Survey: Is the patient deaf or have difficulty hearing?: No Does the patient have difficulty seeing, even when wearing glasses/contacts?: No Does the patient have difficulty concentrating, remembering, or making decisions?: No Does the patient have difficulty walking or climbing stairs?: No Does the patient have difficulty dressing or bathing?: No Does the patient have difficulty doing errands alone such as visiting a doctor's office or shopping?: No    Assessment &  Plan  1. Uncontrolled hypertension  -EKG - normal sinus rhythm  - COMPLETE METABOLIC PANEL WITH GFR - CBC with Differential/Platelet - TSH - lisinopril-hydrochlorothiazide (PRINZIDE,ZESTORETIC) 20-25 MG tablet; Take 1 tablet by mouth daily.  Dispense: 30 tablet; Refill: 0 - amLODipine (NORVASC) 2.5 MG tablet; Take 1 tablet (2.5 mg total) by mouth daily.  Dispense: 30 tablet; Refill: 0  2. Flu vaccine need  Refused   3. Colon cancer screening  - cologuard  4. Need for diphtheria-tetanus-pertussis (Tdap) vaccine  Refused   5. Need for hepatitis C screening test  - Hepatitis C antibody  6. Diabetes mellitus screening  - Hemoglobin A1c  7. Lipid  screening  - Lipid panel  8. Routine screening for STI (sexually transmitted infection)  - HIV antibody - RPR  9. Left lower quadrant abdominal mass  Reviewed US  10. Prostate cancer screening  - PSA

## 2018-02-20 ENCOUNTER — Other Ambulatory Visit: Payer: Self-pay | Admitting: Family Medicine

## 2018-02-20 DIAGNOSIS — E7800 Pure hypercholesterolemia, unspecified: Secondary | ICD-10-CM

## 2018-02-20 DIAGNOSIS — R7303 Prediabetes: Secondary | ICD-10-CM | POA: Insufficient documentation

## 2018-02-20 DIAGNOSIS — E785 Hyperlipidemia, unspecified: Secondary | ICD-10-CM | POA: Insufficient documentation

## 2018-02-20 LAB — CBC WITH DIFFERENTIAL/PLATELET
BASOS ABS: 83 {cells}/uL (ref 0–200)
Basophils Relative: 0.8 %
EOS ABS: 135 {cells}/uL (ref 15–500)
Eosinophils Relative: 1.3 %
HEMATOCRIT: 47.5 % (ref 38.5–50.0)
HEMOGLOBIN: 15.6 g/dL (ref 13.2–17.1)
Lymphs Abs: 2122 cells/uL (ref 850–3900)
MCH: 28 pg (ref 27.0–33.0)
MCHC: 32.8 g/dL (ref 32.0–36.0)
MCV: 85.1 fL (ref 80.0–100.0)
MONOS PCT: 11 %
MPV: 11.1 fL (ref 7.5–12.5)
NEUTROS ABS: 6916 {cells}/uL (ref 1500–7800)
Neutrophils Relative %: 66.5 %
Platelets: 279 10*3/uL (ref 140–400)
RBC: 5.58 10*6/uL (ref 4.20–5.80)
RDW: 13.3 % (ref 11.0–15.0)
Total Lymphocyte: 20.4 %
WBC: 10.4 10*3/uL (ref 3.8–10.8)
WBCMIX: 1144 {cells}/uL — AB (ref 200–950)

## 2018-02-20 LAB — COMPLETE METABOLIC PANEL WITH GFR
AG RATIO: 1.4 (calc) (ref 1.0–2.5)
ALBUMIN MSPROF: 4.3 g/dL (ref 3.6–5.1)
ALT: 25 U/L (ref 9–46)
AST: 23 U/L (ref 10–35)
Alkaline phosphatase (APISO): 85 U/L (ref 40–115)
BILIRUBIN TOTAL: 0.4 mg/dL (ref 0.2–1.2)
BUN: 10 mg/dL (ref 7–25)
CHLORIDE: 103 mmol/L (ref 98–110)
CO2: 28 mmol/L (ref 20–32)
Calcium: 9.7 mg/dL (ref 8.6–10.3)
Creat: 1.02 mg/dL (ref 0.70–1.33)
GFR, EST AFRICAN AMERICAN: 93 mL/min/{1.73_m2} (ref >=60)
GFR, Est Non African American: 80 mL/min/{1.73_m2} (ref >=60)
GLOBULIN: 3.1 g/dL (ref 1.9–3.7)
GLUCOSE: 93 mg/dL (ref 65–99)
Potassium: 4.2 mmol/L (ref 3.5–5.3)
SODIUM: 138 mmol/L (ref 135–146)
TOTAL PROTEIN: 7.4 g/dL (ref 6.1–8.1)

## 2018-02-20 LAB — TSH: TSH: 0.56 m[IU]/L (ref 0.40–4.50)

## 2018-02-20 LAB — LIPID PANEL
Cholesterol: 258 mg/dL — ABNORMAL HIGH (ref <200)
HDL: 73 mg/dL (ref >40)
LDL CHOLESTEROL (CALC): 170 mg/dL — AB
Non-HDL Cholesterol (Calc): 185 mg/dL (calc) — ABNORMAL HIGH (ref <130)
Total CHOL/HDL Ratio: 3.5 (calc) (ref <5.0)
Triglycerides: 59 mg/dL (ref <150)

## 2018-02-20 LAB — PSA: PSA: 0.8 ng/mL (ref <=4.0)

## 2018-02-20 LAB — HEMOGLOBIN A1C
EAG (MMOL/L): 6.5 (calc)
Hgb A1c MFr Bld: 5.7 % of total Hgb — ABNORMAL HIGH (ref <5.7)
Mean Plasma Glucose: 117 (calc)

## 2018-02-20 LAB — HEPATITIS C ANTIBODY
Hepatitis C Ab: NONREACTIVE
SIGNAL TO CUT-OFF: 0.01 (ref <1.00)

## 2018-02-20 LAB — RPR: RPR: NONREACTIVE

## 2018-02-20 LAB — HIV ANTIBODY (ROUTINE TESTING W REFLEX): HIV 1&2 Ab, 4th Generation: NONREACTIVE

## 2018-02-20 MED ORDER — ATORVASTATIN CALCIUM 40 MG PO TABS: 40.0000 mg | ORAL_TABLET | Freq: Every day | ORAL | 1 refills | Status: DC

## 2018-02-21 ENCOUNTER — Encounter: Payer: Self-pay | Admitting: Family Medicine

## 2018-02-28 LAB — COLOGUARD: COLOGUARD: NEGATIVE

## 2018-03-08 ENCOUNTER — Telehealth: Payer: Self-pay

## 2018-03-08 ENCOUNTER — Encounter: Payer: Self-pay | Admitting: Family Medicine

## 2018-03-08 NOTE — Telephone Encounter (Signed)
Called to inform patient his Cologuard results have came in showing a Negative Screening for Colon Cancer. Patient voicemail had not been set up and wanted to inform him we would not have to do another screening for 3 years.

## 2018-03-09 NOTE — Telephone Encounter (Signed)
Attempted to call about Cologuard. Voicemail not set up.

## 2018-03-17 ENCOUNTER — Other Ambulatory Visit: Payer: Self-pay | Admitting: Family Medicine

## 2018-03-17 DIAGNOSIS — I10 Essential (primary) hypertension: Secondary | ICD-10-CM

## 2018-03-17 NOTE — Telephone Encounter (Signed)
Refill request for Hypertension medication:  Lisinopril-HCTZ 20-25 mg Amlodipine 2.5 mg  Last office visit pertaining to hypertension: 02/17/2018  BP Readings from Last 3 Encounters:  02/17/18 (!) 176/122  02/04/17 139/74  12/16/15 (!) 148/78     Lab Results  Component Value Date   CREATININE 1.02 02/17/2018   BUN 10 02/17/2018   NA 138 02/17/2018   K 4.2 02/17/2018   CL 103 02/17/2018   CO2 28 02/17/2018   Follow-ups on file. 03/21/2018

## 2018-03-21 ENCOUNTER — Ambulatory Visit: Payer: BLUE CROSS/BLUE SHIELD | Admitting: Family Medicine

## 2018-03-29 ENCOUNTER — Other Ambulatory Visit: Payer: Self-pay | Admitting: Family Medicine

## 2018-03-29 DIAGNOSIS — I10 Essential (primary) hypertension: Secondary | ICD-10-CM

## 2018-03-29 MED ORDER — AMLODIPINE BESYLATE 2.5 MG PO TABS: 2.5000 mg | ORAL_TABLET | Freq: Every day | ORAL | 0 refills | Status: DC

## 2018-03-29 MED ORDER — LISINOPRIL-HYDROCHLOROTHIAZIDE 20-25 MG PO TABS: 1.0000 | ORAL_TABLET | Freq: Every day | ORAL | 0 refills | Status: DC

## 2018-03-29 NOTE — Telephone Encounter (Signed)
Hypertension medication request: Lisinopril-HCTZ and Amlodipine   Last office visit pertaining to hypertension: 02/17/2018   BP Readings from Last 3 Encounters:  02/17/18 (!) 176/122  02/04/17 139/74  12/16/15 (!) 148/78    Lab Results  Component Value Date   CREATININE 1.02 02/17/2018   BUN 10 02/17/2018   NA 138 02/17/2018   K 4.2 02/17/2018   CL 103 02/17/2018   CO2 28 02/17/2018     Follow up 04/25/2018

## 2018-03-29 NOTE — Telephone Encounter (Signed)
Copied from CRM [id number]. Topic: Quick Communication - See Telephone Encounter >> Mar 29, 2018  1:59 PM Terisa Starraylor, Brittany L wrote: CRM for notification. See Telephone encounter for: 03/29/18.  Patient said he accidentally missed his appt on 4/2. He said he desperately needs a refill on lisinopril-hydrochlorothiazide (PRINZIDE,ZESTORETIC) 20-25 MG tablet & amLODipine (NORVASC) 2.5 MG tablet. I did re-scheduled him for May. (next available) [id number] - [place], [place] - [address]   Also,he wants to know what his results were from the labs from march. Please call back [phone]

## 2018-03-29 NOTE — Telephone Encounter (Signed)
Patient did not keep his appointment for HTN follow-up earlier this month His new primary is out of the office today Last Cr and K+ reviewed I will approve 7 days, and leave further refills up to primary, as dose may be adjusted

## 2018-04-25 ENCOUNTER — Ambulatory Visit: Payer: BLUE CROSS/BLUE SHIELD | Admitting: Family Medicine

## 2018-05-04 ENCOUNTER — Ambulatory Visit: Payer: BLUE CROSS/BLUE SHIELD | Admitting: Family Medicine

## 2018-07-02 ENCOUNTER — Other Ambulatory Visit: Payer: Self-pay | Admitting: Family Medicine

## 2018-07-02 NOTE — Telephone Encounter (Signed)
He is due for follow up, sending 30 days, but must be seen for more refills.

## 2018-07-03 NOTE — Telephone Encounter (Signed)
LVM for pt to call and schedule an appt °

## 2020-03-01 ENCOUNTER — Ambulatory Visit: Payer: Self-pay | Attending: Internal Medicine

## 2020-03-01 DIAGNOSIS — Z23 Encounter for immunization: Secondary | ICD-10-CM

## 2020-03-01 NOTE — Progress Notes (Signed)
   Covid-19 Vaccination Clinic  Name:  BRAXSTON QUINTER    MRN: [id number] DOB: [birth year]  03/01/2020  Mr. Donoghue was observed post Covid-19 immunization for 15 minutes without incident. He was provided with Vaccine Information Sheet and instruction to access the V-Safe system.   Mr. Higinbotham was instructed to call 911 with any severe reactions post vaccine: Marland Kitchen Difficulty breathing  . Swelling of face and throat  . A fast heartbeat  . A bad rash all over body  . Dizziness and weakness   Immunizations Administered    Name Date Dose VIS Date Route   Pfizer COVID-19 Vaccine 03/01/2020  9:41 AM 0.3 mL 11/30/2019 Intramuscular   Manufacturer: [employer], [employer]   Lot: [id number]   NDC: [address]-0

## 2020-03-25 ENCOUNTER — Other Ambulatory Visit: Payer: Self-pay

## 2020-03-25 ENCOUNTER — Ambulatory Visit: Payer: Self-pay | Attending: Internal Medicine

## 2020-03-25 DIAGNOSIS — Z23 Encounter for immunization: Secondary | ICD-10-CM

## 2020-03-25 NOTE — Progress Notes (Signed)
   Covid-19 Vaccination Clinic  Name:  Terry Pearson    MRN: [id number] DOB: 05/22/1961  03/25/2020  [name] was observed post Covid-19 immunization for 15 minutes without incident. He was provided with Vaccine Information Sheet and instruction to access the V-Safe system.   [name] was instructed to call 911 with any severe reactions post vaccine: Marland Kitchen Difficulty breathing  . Swelling of face and throat  . A fast heartbeat  . A bad rash all over body  . Dizziness and weakness   Immunizations Administered    Name Date Dose VIS Date Route   Pfizer COVID-19 Vaccine 03/25/2020 11:47 AM 0.3 mL 11/30/2019 Intramuscular   Manufacturer: [employer], [employer]   Lot: [id number]   NDC: [address]-3

## 2023-05-11 ENCOUNTER — Other Ambulatory Visit: Payer: Self-pay

## 2023-05-11 ENCOUNTER — Emergency Department: Payer: Medicaid Other

## 2023-05-11 DIAGNOSIS — Z20822 Contact with and (suspected) exposure to covid-19: Secondary | ICD-10-CM | POA: Diagnosis not present

## 2023-05-11 DIAGNOSIS — I10 Essential (primary) hypertension: Secondary | ICD-10-CM | POA: Diagnosis not present

## 2023-05-11 DIAGNOSIS — N390 Urinary tract infection, site not specified: Secondary | ICD-10-CM | POA: Insufficient documentation

## 2023-05-11 DIAGNOSIS — B9689 Other specified bacterial agents as the cause of diseases classified elsewhere: Secondary | ICD-10-CM | POA: Insufficient documentation

## 2023-05-11 DIAGNOSIS — R42 Dizziness and giddiness: Secondary | ICD-10-CM | POA: Diagnosis not present

## 2023-05-11 DIAGNOSIS — R188 Other ascites: Secondary | ICD-10-CM | POA: Insufficient documentation

## 2023-05-11 DIAGNOSIS — R55 Syncope and collapse: Secondary | ICD-10-CM | POA: Insufficient documentation

## 2023-05-11 LAB — BASIC METABOLIC PANEL
Anion gap: 6 (ref 5–15)
BUN: 19 mg/dL (ref 8–23)
CO2: 24 mmol/L (ref 22–32)
Calcium: 7.7 mg/dL — ABNORMAL LOW (ref 8.9–10.3)
Chloride: 110 mmol/L (ref 98–111)
Creatinine, Ser: 1.07 mg/dL (ref 0.61–1.24)
GFR, Estimated: >60 mL/min (ref >60)
Glucose, Bld: 137 mg/dL — ABNORMAL HIGH (ref 70–99)
Potassium: 3.8 mmol/L (ref 3.5–5.1)
Sodium: 140 mmol/L (ref 135–145)

## 2023-05-11 LAB — CBC
HCT: 45.8 % (ref 39.0–52.0)
Hemoglobin: 14.8 g/dL (ref 13.0–17.0)
MCH: 29 pg (ref 26.0–34.0)
MCHC: 32.3 g/dL (ref 30.0–36.0)
MCV: 89.6 fL (ref 80.0–100.0)
Platelets: 252 10*3/uL (ref 150–400)
RBC: 5.11 MIL/uL (ref 4.22–5.81)
RDW: 14.5 % (ref 11.5–15.5)
WBC: 22.4 10*3/uL — ABNORMAL HIGH (ref 4.0–10.5)
nRBC: 0 % (ref 0.0–0.2)

## 2023-05-11 NOTE — ED Triage Notes (Signed)
Pt reports was going to get in the shower when he began feeling weak, dizzy, diaphoretic. Pt called his sister and told her he didn't feel well and she called EMS. Pt reports laid down on floor and passed out. Denies hitting head. Denies chest pain or pressure prior to episode. Reports hypotensive on EMS arrival. Pt currently alert and oriented following commands. Denies h/a or dizziness currently. Pt breathing unlabored speaking in full sentences. Symmetric chest rise and fall.

## 2023-05-11 NOTE — ED Triage Notes (Signed)
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

## 2023-05-12 ENCOUNTER — Emergency Department: Payer: Medicaid Other

## 2023-05-12 ENCOUNTER — Emergency Department
Admission: EM | Admit: 2023-05-12 | Discharge: 2023-05-12 | Disposition: A | Discharge status: Home / Self Care | Payer: Medicaid Other | Attending: Emergency Medicine | Admitting: Emergency Medicine

## 2023-05-12 DIAGNOSIS — R188 Other ascites: Secondary | ICD-10-CM

## 2023-05-12 DIAGNOSIS — N390 Urinary tract infection, site not specified: Secondary | ICD-10-CM

## 2023-05-12 DIAGNOSIS — R55 Syncope and collapse: Secondary | ICD-10-CM

## 2023-05-12 LAB — URINALYSIS, ROUTINE W REFLEX MICROSCOPIC
Bilirubin Urine: NEGATIVE
Glucose, UA: >=500 mg/dL — AB
Hgb urine dipstick: NEGATIVE
Ketones, ur: NEGATIVE mg/dL
Nitrite: POSITIVE — AB
Protein, ur: NEGATIVE mg/dL
Specific Gravity, Urine: 1.016 (ref 1.005–1.030)
pH: 5 (ref 5.0–8.0)

## 2023-05-12 LAB — CBC WITH DIFFERENTIAL/PLATELET
Abs Immature Granulocytes: 0.35 10*3/uL — ABNORMAL HIGH (ref 0.00–0.07)
Basophils Absolute: 0.1 10*3/uL (ref 0.0–0.1)
Basophils Relative: 0 %
Eosinophils Absolute: 0 10*3/uL (ref 0.0–0.5)
Eosinophils Relative: 0 %
HCT: 46.4 % (ref 39.0–52.0)
Hemoglobin: 14.7 g/dL (ref 13.0–17.0)
Immature Granulocytes: 2 %
Lymphocytes Relative: 5 %
Lymphs Abs: 1.2 10*3/uL (ref 0.7–4.0)
MCH: 28.5 pg (ref 26.0–34.0)
MCHC: 31.7 g/dL (ref 30.0–36.0)
MCV: 90.1 fL (ref 80.0–100.0)
Monocytes Absolute: 1.8 10*3/uL — ABNORMAL HIGH (ref 0.1–1.0)
Monocytes Relative: 8 %
Neutro Abs: 19.3 10*3/uL — ABNORMAL HIGH (ref 1.7–7.7)
Neutrophils Relative %: 85 %
Platelets: 252 10*3/uL (ref 150–400)
RBC: 5.15 MIL/uL (ref 4.22–5.81)
RDW: 14.6 % (ref 11.5–15.5)
WBC: 22.7 10*3/uL — ABNORMAL HIGH (ref 4.0–10.5)
nRBC: 0 % (ref 0.0–0.2)

## 2023-05-12 LAB — CK: Total CK: 182 U/L (ref 49–397)

## 2023-05-12 LAB — SARS CORONAVIRUS 2 BY RT PCR: SARS Coronavirus 2 by RT PCR: NEGATIVE

## 2023-05-12 LAB — TROPONIN I (HIGH SENSITIVITY): Troponin I (High Sensitivity): 4 ng/L (ref <18)

## 2023-05-12 LAB — HEPATIC FUNCTION PANEL
ALT: 19 U/L (ref 0–44)
AST: 21 U/L (ref 15–41)
Albumin: 3.3 g/dL — ABNORMAL LOW (ref 3.5–5.0)
Alkaline Phosphatase: 45 U/L (ref 38–126)
Bilirubin, Direct: 0.1 mg/dL (ref 0.0–0.2)
Indirect Bilirubin: 0.7 mg/dL (ref 0.3–0.9)
Total Bilirubin: 0.8 mg/dL (ref 0.3–1.2)
Total Protein: 6.3 g/dL — ABNORMAL LOW (ref 6.5–8.1)

## 2023-05-12 MED ORDER — LACTATED RINGERS IV BOLUS
1000.0000 mL | Freq: Once | INTRAVENOUS | Status: AC
  Administered 2023-05-12: 1000 mL via INTRAVENOUS

## 2023-05-12 MED ORDER — IOHEXOL 300 MG/ML  SOLN
100.0000 mL | Freq: Once | INTRAMUSCULAR | Status: AC | PRN
  Administered 2023-05-12: 100 mL via INTRAVENOUS

## 2023-05-12 MED ORDER — SODIUM CHLORIDE 0.9 % IV SOLN
1.0000 g | Freq: Once | INTRAVENOUS | Status: AC
  Administered 2023-05-12: 1 g via INTRAVENOUS
  Filled 2023-05-12: qty 10

## 2023-05-12 MED ORDER — CEPHALEXIN 500 MG PO CAPS: 500.0000 mg | ORAL_CAPSULE | Freq: Four times a day (QID) | ORAL | 0 refills | Status: AC

## 2023-05-12 NOTE — ED Notes (Signed)
Lab called again to add on CK and troponin that was not added when called when lab was ordered.

## 2023-05-12 NOTE — Discharge Instructions (Signed)
We found that your white blood cell count was elevated and you may have a urinary tract infection.  Please take the antibiotic 4 times a day for the next 7 days.  On the CT of your abdomen you do have some free fluid in the abdomen which is nonspecific.  Please return to the emergency department if you develop worsening abdominal pain fevers or anything else that is concerning to you.  Otherwise please follow-up with your primary care doctor.

## 2023-05-12 NOTE — ED Provider Notes (Signed)
[facility] Provider Note    Event Date/Time   First MD Initiated Contact with Patient 05/12/23 0142     (approximate)   History   syncope   HPI  Terry Pearson is a 62 y.o. male past medical history of hypertension who presents because of an episode of loss of consciousness.  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  He was then going to take a shower when he suddenly felt very warm and lightheaded like his vision was going out.  He laid on the floor to try to cool off and apparently lost consciousness.  Patient is to children are with him but they did not witness the event.  They were told by another family that he lost consciousness laying down and then they tried to get him up again and he lost consciousness again.  He did defecate on himself and had an episode of emesis.  Says prior to the event he had very mild stomach ache for several minutes but this is resolved he denies any ongoing abdominal pain.  Denies any chest pain or dyspnea.  Currently he is asymptomatic denies headache or any other symptoms.He denies urinary symptoms.  Denies any recent illnesses no fevers cough congestion or upper respiratory tract symptoms.     Past Medical History:  Diagnosis Date   Allergy    Hypertension     Patient Active Problem List   Diagnosis Date Noted   Hyperlipidemia 02/20/2018   Pre-diabetes 02/20/2018   Hypertension 10/21/2015   Chronic low back pain with left-sided sciatica 07/08/2015     Physical Exam  Triage Vital Signs: ED Triage Vitals  Enc Vitals Group     BP 05/11/23 2254 101/70     Pulse Rate 05/11/23 2254 88     Resp 05/11/23 2254 19     Temp 05/11/23 2254 97.7 F (36.5 C)     Temp Source 05/11/23 2254 Oral     SpO2 05/11/23 2223 95 %     Weight 05/11/23 2254 178 lb (80.7 kg)     Height 05/11/23 2254 6\' 1"  (1.854 m)     Head Circumference --      Peak Flow --      Pain Score 05/11/23  2254 0     Pain Loc --      Pain Edu? --      Excl. in GC? --     Most recent vital signs: Vitals:   05/12/23 0230 05/12/23 0400  BP: 127/88 131/86  Pulse: 73 77  Resp: 16 17  Temp: 97.9 F (36.6 C)   SpO2: 100% 97%     General: Awake, no distress.  Mucous membranes are somewhat dry CV:  Good peripheral perfusion.  Resp:  Normal effort.  Lung sounds are clear Abd:  No distention.  Abdomen is soft nontender Neuro:             Awake, Alert, Oriented x 3  Other:     ED Results / Procedures / Treatments  Labs (all labs ordered are listed, but only abnormal results are displayed) Labs Reviewed  BASIC METABOLIC PANEL - Abnormal; Notable for the following components:      Result Value   Glucose, Bld 137 (*)    Calcium 7.7 (*)    All other components within normal limits  CBC - Abnormal; Notable for the following components:   WBC 22.4 (*)  All other components within normal limits  URINALYSIS, ROUTINE W REFLEX MICROSCOPIC - Abnormal; Notable for the following components:   Color, Urine YELLOW (*)    APPearance HAZY (*)    Glucose, UA >=500 (*)    Nitrite POSITIVE (*)    Leukocytes,Ua TRACE (*)    Bacteria, UA RARE (*)    All other components within normal limits  HEPATIC FUNCTION PANEL - Abnormal; Notable for the following components:   Total Protein 6.3 (*)    Albumin 3.3 (*)    All other components within normal limits  CBC WITH DIFFERENTIAL/PLATELET - Abnormal; Notable for the following components:   WBC 22.7 (*)    Neutro Abs 19.3 (*)    Monocytes Absolute 1.8 (*)    Abs Immature Granulocytes 0.35 (*)    All other components within normal limits  SARS CORONAVIRUS 2 BY RT PCR  URINE CULTURE  CK  CBC WITH DIFFERENTIAL/PLATELET  CBC WITH DIFFERENTIAL/PLATELET  CBC WITH DIFFERENTIAL/PLATELET  CBG MONITORING, ED  TROPONIN I (HIGH SENSITIVITY)     EKG EKG interpretation performed by myself: NSR, RAD, nml intervals, no acute ischemic  changes     RADIOLOGY I reviewed and interpreted the CXR which does not show any acute cardiopulmonary process    PROCEDURES:  Critical Care performed: No  Procedures  The patient is on the cardiac monitor to evaluate for evidence of arrhythmia and/or significant heart rate changes.   MEDICATIONS ORDERED IN ED: Medications  lactated ringers bolus 1,000 mL (0 mLs Intravenous Stopped 05/12/23 0359)  lactated ringers bolus 1,000 mL (0 mLs Intravenous Stopped 05/12/23 0359)  cefTRIAXone (ROCEPHIN) 1 g in sodium chloride 0.9 % 100 mL IVPB (0 g Intravenous Stopped 05/12/23 0415)  iohexol (OMNIPAQUE) 300 MG/ML solution 100 mL (100 mLs Intravenous Contrast Given 05/12/23 0459)     IMPRESSION / MDM / ASSESSMENT AND PLAN / ED COURSE  I reviewed the triage vital signs and the nursing notes.                              Patient's presentation is most consistent with acute presentation with potential threat to life or bodily function.  Differential diagnosis includes, but is not limited to, vasovagal syncope, orthostatic hypotension, dehydration, AKI, sepsis, cardiogenic syncope including cardiac arrhythmia or valvular Disorder The patient is a 62 year old male who is relatively healthy who presents after syncopal episode.  Patient was about to shower when he suddenly felt warm and lightheaded nauseous and like his vision was going out.  He laid himself on the floor and then apparently lost consciousness and when they tried to get him up again lost consciousness for a second time.  He did defecate on himself.  Prior to this he said he had a slight stomachache but this has resolved and he denies any ongoing abdominal pain nausea or vomiting or diarrhea.  Patient's vital signs are reassuring his blood pressure is normal is afebrile with normal heart rate.  Looks quite well on exam abdominal exam is benign lung sounds are clear looks somewhat dry.  Labs are notable for leukocytosis to 22.   While this could be somewhat dilutional, the rest of his blood counts are normal.  I have added on a differential.  His BMP and hepatic function panel are reassuring.  CK is negative.  Chest x-ray is clear.  Did obtain urinalysis to evaluate for possible infection and it is nitrite positive with 6-10  white cells.  He is not having any urinary symptoms but with this urine and leukocytosis we will treat with antibiotics.  Patient given dose of Rocephin in the ED.    Differential shows neutrophil predominant leukocytosis.  With this degree of leukocytosis and prior abdominal pain I did obtain a CT of the abdomen pelvis abdomen abundance of caution.  There is small volume of free fluid in the abdomen but otherwise the bowel looks normal there is no evidence of perforation or inflammation.  Patient has no history of cirrhosis so unclear what the cause of this ascites is.  On repeat assessment patient has no abdominal pain and abdomen is completely nontender.  I discussed the free fluid in the abdomen and we discussed strict return precautions for new or worsening abdominal pain or fever.  At this point given he has no symptoms I do think that he can safely be discharged.  Will discharge with 7 days of keflex for UTI.       FINAL CLINICAL IMPRESSION(S) / ED DIAGNOSES   Final diagnoses:  Syncope, unspecified syncope type  Other ascites  Urinary tract infection without hematuria, site unspecified     Rx / DC Orders   ED Discharge Orders     None        Note:  This document was prepared using Dragon voice recognition software and may include unintentional dictation errors.   Georga Hacking, MD 05/12/23 [phone]

## 2023-05-12 NOTE — ED Notes (Signed)
Pt to CT

## 2023-05-12 NOTE — ED Notes (Signed)
Pt back from CT

## 2023-05-13 LAB — URINE CULTURE: Culture: >=100000 — AB

## 2023-05-14 LAB — URINE CULTURE

## 2024-07-12 ENCOUNTER — Ambulatory Visit (INDEPENDENT_AMBULATORY_CARE_PROVIDER_SITE_OTHER): Admitting: Physician Assistant

## 2024-07-12 ENCOUNTER — Encounter: Payer: Self-pay | Admitting: Physician Assistant

## 2024-07-12 VITALS — BP 168/102 | HR 75 | Temp 98.1°F | Ht 73.0 in | Wt 170.0 lb

## 2024-07-12 DIAGNOSIS — I10 Essential (primary) hypertension: Secondary | ICD-10-CM

## 2024-07-12 DIAGNOSIS — E7800 Pure hypercholesterolemia, unspecified: Secondary | ICD-10-CM | POA: Diagnosis not present

## 2024-07-12 MED ORDER — ATORVASTATIN CALCIUM 40 MG PO TABS
40.0000 mg | ORAL_TABLET | Freq: Every day | ORAL | 1 refills | Status: DC
Start: 2024-07-12 — End: 2024-10-03

## 2024-07-12 MED ORDER — AMLODIPINE BESYLATE 5 MG PO TABS: 5.0000 mg | ORAL_TABLET | Freq: Every day | ORAL | 1 refills | Status: DC

## 2024-07-12 NOTE — Assessment & Plan Note (Signed)
 Refill atorvastatin .  Plan to check fasting labs next time and modify the dose accordingly.

## 2024-07-12 NOTE — Progress Notes (Signed)
 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  His last visit to primary care was in 2019, and his history of medication use is somewhat confusing, likely with breaks in compliance.  At his last visit 6 years ago, he was prescribed lisinopril -HCTZ.  From what I can gather, it sounds like he was using this on and off, apparently his sister was prescribed the same medication but her provider took her off of it, so she gave it to him.  Patient states he thinks the lisinopril  caused swelling of the lips, so about 3 weeks ago he stopped and says the swelling has resolved.  At present, he is not taking any medication.  Long overdue for routine care especially with his chronic conditions.  For today he just wants to focus on refilling blood pressure medication.  He would like to schedule a physical at a future date.  He does not like needles.     Medication list has been reviewed and updated.  No outpatient medications have been marked as taking for the 07/12/24 encounter (Office Visit) with Manya Toribio SQUIBB, PA.     Review of Systems  Patient Active Problem List   Diagnosis Date Noted   Hyperlipidemia 02/20/2018   Pre-diabetes 02/20/2018   Hypertension 10/21/2015   Chronic low back pain with left-sided sciatica 07/08/2015    Allergies  Allergen Reactions   Lisinopril  Swelling   Tramadol  Other (See Comments)    Made him feel like he was drunk.    Immunization History  Administered Date(s) Administered   PFIZER(Purple Top)SARS-COV-2 Vaccination 03/01/2020, 03/25/2020    Past Surgical History:  Procedure Laterality Date   INCISION AND DRAINAGE PERITONSILLAR  ABSCESS     KNEE ARTHROSCOPY Left     Social History   Tobacco Use   Smoking status: Former    Current packs/day: 0.00    Average packs/day: 0.2 packs/day for 4.2 years (1.0 ttl pk-yrs)    Types: Cigarettes    Start date: 12/20/1998    Quit date: 02/18/2003    Years since quitting: 21.4   Smokeless tobacco: Never  Vaping Use   Vaping status: Never Used  Substance Use Topics   Alcohol use: Yes    Alcohol/week: 1.0 standard drink of alcohol    Types: 1 Standard drinks or equivalent per week    Comment: 2-3 beers a day   Drug use: No    Family History  Problem Relation Age of Onset   Hypertension Mother    Cancer Mother        breast   Hypertension Father    Diabetes Father    Cancer Sister        Pancreatic Cancer   Leukemia Maternal Grandfather    Prostate cancer Paternal Grandfather         07/12/2024    9:22 AM  GAD 7 : Generalized Anxiety Score  Nervous, Anxious, on Edge 0  Control/stop worrying 0  Worry too much - different things 0  Trouble relaxing 0  Restless 0  Easily annoyed or irritable 0  Afraid - awful might happen 0  Total GAD  7 Score 0  Anxiety Difficulty Not difficult at all       07/12/2024    9:22 AM 02/17/2018   10:02 AM 02/04/2017    2:38 PM  Depression screen PHQ 2/9  Decreased Interest 0 0 0  Down, Depressed, Hopeless 0 0 0  PHQ - 2 Score 0 0 0    BP Readings from Last 3 Encounters:  07/12/24 (!) 168/102  05/12/23 123/89  02/17/18 (!) 176/122    Wt Readings from Last 3 Encounters:  07/12/24 170 lb (77.1 kg)  05/11/23 178 lb (80.7 kg)  02/17/18 197 lb 3.2 oz (89.4 kg)    BP (!) 168/102 (Cuff Size: Normal)   Pulse 75   Temp 98.1 F (36.7 C)   Ht 6' 1 (1.854 m)   Wt 170 lb (77.1 kg)   SpO2 98%   BMI 22.43 kg/m   Physical Exam Vitals and nursing note reviewed.  Constitutional:      Appearance: Normal appearance.  Neck:     Vascular: No carotid bruit.  Cardiovascular:     Rate and Rhythm: Normal rate and regular  rhythm.     Heart sounds: No murmur heard.    No friction rub. No gallop.  Pulmonary:     Effort: Pulmonary effort is normal.     Breath sounds: Normal breath sounds.  Abdominal:     General: There is no distension.  Musculoskeletal:        General: Normal range of motion.  Skin:    General: Skin is warm and dry.  Neurological:     Mental Status: He is alert and oriented to person, place, and time.     Gait: Gait is intact.  Psychiatric:        Mood and Affect: Mood and affect normal.     Recent Labs     Component Value Date/Time   NA 140 05/11/2023 2305   NA 139 12/16/2015 0952   NA 136 08/06/2012 1458   K 3.8 05/11/2023 2305   K 4.0 08/06/2012 1458   CL 110 05/11/2023 2305   CL 101 08/06/2012 1458   CO2 24 05/11/2023 2305   CO2 29 08/06/2012 1458   GLUCOSE 137 (H) 05/11/2023 2305   GLUCOSE 100 (H) 08/06/2012 1458   BUN 19 05/11/2023 2305   BUN 12 12/16/2015 0952   BUN 9 08/06/2012 1458   CREATININE 1.07 05/11/2023 2305   CREATININE 1.02 02/17/2018 1048   CALCIUM  7.7 (L) 05/11/2023 2305   CALCIUM  9.4 08/06/2012 1458   PROT 6.3 (L) 05/12/2023 0200   PROT 7.2 12/16/2015 0952   ALBUMIN 3.3 (L) 05/12/2023 0200   ALBUMIN 4.2 12/16/2015 0952   AST 21 05/12/2023 0200   ALT 19 05/12/2023 0200   ALKPHOS 45 05/12/2023 0200   BILITOT 0.8 05/12/2023 0200   BILITOT 0.3 12/16/2015 0952   GFRNONAA >60 05/11/2023 2305   GFRNONAA 80 02/17/2018 1048   GFRAA 93 02/17/2018 1048    Lab Results  Component Value Date   WBC 22.4 (H) 05/11/2023   WBC 22.7 (H) 05/11/2023   HGB 14.8 05/11/2023   HGB 14.7 05/11/2023   HCT 45.8 05/11/2023   HCT 46.4 05/11/2023   MCV 89.6 05/11/2023   MCV 90.1 05/11/2023   PLT 252 05/11/2023   PLT 252 05/11/2023   Lab Results  Component Value Date   HGBA1C 5.7 (H) 02/17/2018   Lab Results  Component Value Date   CHOL 258 (H) 02/17/2018   HDL  73 02/17/2018   LDLCALC 170 (H) 02/17/2018   TRIG 59 02/17/2018   CHOLHDL 3.5 02/17/2018    Lab Results  Component Value Date   TSH 0.56 02/17/2018     Assessment and Plan:  Primary hypertension Assessment & Plan: Will need to restart antihypertensive regimen with titration, beginning with amlodipine  5 mg daily.  Patient has a blood pressure cuff at home but needs to replace the batteries; advised him to do this and continue home BP monitoring at least twice per week with a log for my review next visit.  Orders: -     amLODIPine  Besylate; Take 1 tablet (5 mg total) by mouth daily.  Dispense: 30 tablet; Refill: 1  Pure hypercholesterolemia Assessment & Plan: Refill atorvastatin .  Plan to check fasting labs next time and modify the dose accordingly.  Orders: -     Atorvastatin  Calcium ; Take 1 tablet (40 mg total) by mouth daily.  Dispense: 30 tablet; Refill: 1     Return in about 4 weeks (around 08/09/2024) for fasting CPE.    Rolan Hoyle, PA-C, DMSc, Nutritionist [facility] [phone]

## 2024-07-12 NOTE — Assessment & Plan Note (Signed)
 Will need to restart antihypertensive regimen with titration, beginning with amlodipine  5 mg daily.  Patient has a blood pressure cuff at home but needs to replace the batteries; advised him to do this and continue home BP monitoring at least twice per week with a log for my review next visit.

## 2024-08-14 ENCOUNTER — Encounter: Admitting: Physician Assistant

## 2024-09-10 ENCOUNTER — Encounter: Admitting: Physician Assistant

## 2024-10-03 ENCOUNTER — Ambulatory Visit (INDEPENDENT_AMBULATORY_CARE_PROVIDER_SITE_OTHER): Admitting: Physician Assistant

## 2024-10-03 ENCOUNTER — Encounter: Payer: Self-pay | Admitting: Physician Assistant

## 2024-10-03 VITALS — BP 140/90 | HR 86 | Temp 98.2°F | Ht 73.0 in | Wt 168.0 lb

## 2024-10-03 DIAGNOSIS — Z2821 Immunization not carried out because of patient refusal: Secondary | ICD-10-CM

## 2024-10-03 DIAGNOSIS — K5790 Diverticulosis of intestine, part unspecified, without perforation or abscess without bleeding: Secondary | ICD-10-CM | POA: Diagnosis not present

## 2024-10-03 DIAGNOSIS — I10 Essential (primary) hypertension: Secondary | ICD-10-CM | POA: Diagnosis not present

## 2024-10-03 DIAGNOSIS — Z1211 Encounter for screening for malignant neoplasm of colon: Secondary | ICD-10-CM | POA: Diagnosis not present

## 2024-10-03 DIAGNOSIS — Z125 Encounter for screening for malignant neoplasm of prostate: Secondary | ICD-10-CM

## 2024-10-03 DIAGNOSIS — E7800 Pure hypercholesterolemia, unspecified: Secondary | ICD-10-CM

## 2024-10-03 DIAGNOSIS — Z23 Encounter for immunization: Secondary | ICD-10-CM | POA: Diagnosis not present

## 2024-10-03 DIAGNOSIS — R351 Nocturia: Secondary | ICD-10-CM

## 2024-10-03 MED ORDER — ATORVASTATIN CALCIUM 40 MG PO TABS
40.0000 mg | ORAL_TABLET | Freq: Every day | ORAL | 1 refills | Status: AC
Start: 2024-10-03 — End: ?

## 2024-10-03 MED ORDER — AMLODIPINE BESYLATE 5 MG PO TABS
5.0000 mg | ORAL_TABLET | Freq: Every day | ORAL | 1 refills | Status: AC
Start: 2024-10-03 — End: ?

## 2024-10-03 NOTE — Progress Notes (Signed)
 Date:  10/03/2024   Name:  Terry Pearson   DOB:  1961/12/17   MRN:  [id number]   Chief Complaint: Annual Exam (Patient presents today for his annual exam. He is doing well. He would like to discuss allergies. 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 Last visit we refilled amlodipine  for him which he reports working well for him while monitoring blood pressure at home.  He says he ran out a few days ago, so his blood pressure might be high today.  No side effects from medication.  Last Physical:  2019 Last Dental Exam: >5y ago  Last Eye Exam: 3-4y ago Last CRC screen: Cologuard 2019 negative. Also had a colonoscopy in the past which per pt was normal except diverticulosis.  Last PSA: 2019 Immunizations Due: Flu, Tdap, Prevnar 20, Shingrix    Medication list has been reviewed and updated.  Current Meds  Medication Sig   MULTIPLE VITAMIN PO Take 1 tablet by mouth daily.   [DISCONTINUED] amLODipine  (NORVASC ) 5 MG tablet Take 1 tablet (5 mg total) by mouth daily.   [DISCONTINUED] atorvastatin  (LIPITOR) 40 MG tablet Take 1 tablet (40 mg total) by mouth daily.     Review of Systems  Patient Active Problem List   Diagnosis Date Noted   Diverticulosis 10/03/2024   Hyperlipidemia 02/20/2018   Pre-diabetes 02/20/2018   Hypertension 10/21/2015   Chronic low back pain with left-sided sciatica 07/08/2015    Allergies  Allergen Reactions   Lisinopril  Swelling   Tramadol  Other (See Comments)    Made him feel like he was drunk.    Immunization History  Administered Date(s) Administered   PFIZER(Purple Top)SARS-COV-2 Vaccination 03/01/2020, 03/25/2020   Tdap 10/03/2024    Past Surgical History:  Procedure Laterality Date   INCISION AND DRAINAGE PERITONSILLAR ABSCESS     KNEE ARTHROSCOPY Left     Social History   Tobacco Use    Smoking status: Former    Current packs/day: 0.00    Average packs/day: 0.2 packs/day for 4.2 years (1.0 ttl pk-yrs)    Types: Cigarettes    Start date: 12/20/1998    Quit date: 02/18/2003    Years since quitting: 21.6   Smokeless tobacco: Never  Vaping Use   Vaping status: Never Used  Substance Use Topics   Alcohol use: Yes    Alcohol/week: 1.0 standard drink of alcohol    Types: 1 Standard drinks or equivalent per week    Comment: 2-3 beers a day   Drug use: No    Family History  Problem Relation Age of Onset   Hypertension Mother    Cancer Mother        breast   Hypertension Father    Diabetes Father    Cancer Sister        Pancreatic Cancer   Leukemia Maternal Grandfather    Prostate cancer Paternal Grandfather         10/03/2024    2:11 PM 07/12/2024    9:22 AM  GAD 7 : Generalized Anxiety Score  Nervous, Anxious, on Edge 0 0  Control/stop worrying 0 0  Worry too much - different things 0 0  Trouble relaxing 0 0  Restless 0 0  Easily annoyed or irritable 0 0  Afraid - awful might happen 0 0  Total GAD 7 Score 0  0  Anxiety Difficulty Not difficult at all Not difficult at all       10/03/2024    2:10 PM 07/12/2024    9:22 AM 02/17/2018   10:02 AM  Depression screen PHQ 2/9  Decreased Interest 0 0 0  Down, Depressed, Hopeless 0 0 0  PHQ - 2 Score 0 0 0  Altered sleeping 3    Tired, decreased energy 0    Change in appetite 0    Feeling bad or failure about yourself  0    Trouble concentrating 0    Moving slowly or fidgety/restless 0    Suicidal thoughts 0    PHQ-9 Score 3    Difficult doing work/chores Not difficult at all      BP Readings from Last 3 Encounters:  10/03/24 (!) 140/90  07/12/24 (!) 168/102  05/12/23 123/89    Wt Readings from Last 3 Encounters:  10/03/24 168 lb (76.2 kg)  07/12/24 170 lb (77.1 kg)  05/11/23 178 lb (80.7 kg)    BP (!) 140/90 (Cuff Size: Normal)   Pulse 86   Temp 98.2 F (36.8 C) (Oral)   Ht 6' 1 (1.854 m)    Wt 168 lb (76.2 kg)   SpO2 96%   BMI 22.16 kg/m   Physical Exam Vitals and nursing note reviewed.  Constitutional:      Appearance: Normal appearance.  HENT:     Ears:     Comments: EAC clear bilaterally with good view of TM which is without effusion or erythema.     Nose: Nose normal.     Mouth/Throat:     Mouth: Mucous membranes are moist. No oral lesions.     Dentition: Abnormal dentition (at least one borken tooth (lower right molar)).     Pharynx: No posterior oropharyngeal erythema.  Eyes:     Extraocular Movements: Extraocular movements intact.     Conjunctiva/sclera: Conjunctivae normal.     Pupils: Pupils are equal, round, and reactive to light.  Neck:     Thyroid: No thyromegaly.  Cardiovascular:     Rate and Rhythm: Normal rate and regular rhythm.     Heart sounds: No murmur heard.    No friction rub. No gallop.     Comments: Pulses 2+ at radial, PT, DP bilaterally. No carotid bruit. No peripheral edema Pulmonary:     Effort: Pulmonary effort is normal.     Breath sounds: Normal breath sounds.  Abdominal:     General: Bowel sounds are normal.     Palpations: Abdomen is soft. There is no mass.     Tenderness: There is no abdominal tenderness.  Genitourinary:    Comments: Genital/rectal exam deferred.  Musculoskeletal:     Comments: Full ROM with strength 5/5 bilateral upper and lower extremities  Lymphadenopathy:     Cervical: No cervical adenopathy.  Skin:    General: Skin is warm.     Capillary Refill: Capillary refill takes less than 2 seconds.     Findings: No lesion or rash.     Comments: 2 cm star-shaped keloid central upper chest  Neurological:     Mental Status: He is alert and oriented to person, place, and time.     Gait: Gait is intact.  Psychiatric:        Mood and Affect: Mood normal.        Behavior: Behavior normal.     Recent Labs     Component Value Date/Time   NA 140  05/11/2023 2305   NA 139 12/16/2015 0952   NA 136 08/06/2012  1458   K 3.8 05/11/2023 2305   K 4.0 08/06/2012 1458   CL 110 05/11/2023 2305   CL 101 08/06/2012 1458   CO2 24 05/11/2023 2305   CO2 29 08/06/2012 1458   GLUCOSE 137 (H) 05/11/2023 2305   GLUCOSE 100 (H) 08/06/2012 1458   BUN 19 05/11/2023 2305   BUN 12 12/16/2015 0952   BUN 9 08/06/2012 1458   CREATININE 1.07 05/11/2023 2305   CREATININE 1.02 02/17/2018 1048   CALCIUM  7.7 (L) 05/11/2023 2305   CALCIUM  9.4 08/06/2012 1458   PROT 6.3 (L) 05/12/2023 0200   PROT 7.2 12/16/2015 0952   ALBUMIN 3.3 (L) 05/12/2023 0200   ALBUMIN 4.2 12/16/2015 0952   AST 21 05/12/2023 0200   ALT 19 05/12/2023 0200   ALKPHOS 45 05/12/2023 0200   BILITOT 0.8 05/12/2023 0200   BILITOT 0.3 12/16/2015 0952   GFRNONAA >60 05/11/2023 2305   GFRNONAA 80 02/17/2018 1048   GFRAA 93 02/17/2018 1048    Lab Results  Component Value Date   WBC 22.4 (H) 05/11/2023   WBC 22.7 (H) 05/11/2023   HGB 14.8 05/11/2023   HGB 14.7 05/11/2023   HCT 45.8 05/11/2023   HCT 46.4 05/11/2023   MCV 89.6 05/11/2023   MCV 90.1 05/11/2023   PLT 252 05/11/2023   PLT 252 05/11/2023   Lab Results  Component Value Date   HGBA1C 5.7 (H) 02/17/2018   Lab Results  Component Value Date   CHOL 258 (H) 02/17/2018   HDL 73 02/17/2018   LDLCALC 170 (H) 02/17/2018   TRIG 59 02/17/2018   CHOLHDL 3.5 02/17/2018   Lab Results  Component Value Date   TSH 0.56 02/17/2018      Assessment and Plan:  1. Primary hypertension (Primary) Continue amlodipine , refilled today.  Check routine labs.  - amLODipine  (NORVASC ) 5 MG tablet; Take 1 tablet (5 mg total) by mouth daily.  Dispense: 90 tablet; Refill: 1 - CBC with Differential/Platelet - Comprehensive metabolic panel with GFR - TSH - Lipid panel  2. Pure hypercholesterolemia Check nonfasting lipids today, refill atorvastatin  40 mg daily - atorvastatin  (LIPITOR) 40 MG tablet; Take 1 tablet (40 mg total) by mouth daily.  Dispense: 90 tablet; Refill: 1 - Lipid panel  3.  Diverticulosis Noted, added to patient history.  4. Screening for colon cancer - Cologuard  5. Screening PSA (prostate specific antigen - PSA  6. Nocturia - PSA  7. Encounter for immunization Tdap administered today - Tdap vaccine greater than or equal to 7yo IM  8. Immunization declined Discussed due for flu, Shingrix, Prevnar 20, but declines all today.  Will consider for future.   Return in about 4 months (around 02/03/2025) for HTN.  Return in 1 year for CPE  Rolan Hoyle, PA-C, DMSc, Nutritionist [facility] Primary [facility] [phone]

## 2024-10-03 NOTE — Patient Instructions (Signed)

## 2024-10-04 ENCOUNTER — Ambulatory Visit: Payer: Self-pay | Admitting: Physician Assistant

## 2024-10-04 LAB — COMPREHENSIVE METABOLIC PANEL WITH GFR
ALT: 27 IU/L (ref 0–44)
AST: 27 IU/L (ref 0–40)
Albumin: 4.5 g/dL (ref 3.9–4.9)
Alkaline Phosphatase: 89 IU/L (ref 47–123)
BUN/Creatinine Ratio: 11 (ref 10–24)
BUN: 11 mg/dL (ref 8–27)
Bilirubin Total: 0.3 mg/dL (ref 0.0–1.2)
CO2: 24 mmol/L (ref 20–29)
Calcium: 9.6 mg/dL (ref 8.6–10.2)
Chloride: 99 mmol/L (ref 96–106)
Creatinine, Ser: 1.01 mg/dL (ref 0.76–1.27)
Globulin, Total: 3.1 g/dL (ref 1.5–4.5)
Glucose: 85 mg/dL (ref 70–99)
Potassium: 5 mmol/L (ref 3.5–5.2)
Sodium: 140 mmol/L (ref 134–144)
Total Protein: 7.6 g/dL (ref 6.0–8.5)
eGFR: 83 mL/min/1.73 (ref >59)

## 2024-10-04 LAB — TSH: TSH: 0.834 u[IU]/mL (ref 0.450–4.500)

## 2024-10-04 LAB — CBC WITH DIFFERENTIAL/PLATELET
Basophils Absolute: 0.1 x10E3/uL (ref 0.0–0.2)
Basos: 1 %
EOS (ABSOLUTE): 0.1 x10E3/uL (ref 0.0–0.4)
Eos: 1 %
Hematocrit: 44.6 % (ref 37.5–51.0)
Hemoglobin: 14.5 g/dL (ref 13.0–17.7)
Immature Grans (Abs): 0 x10E3/uL (ref 0.0–0.1)
Immature Granulocytes: 0 %
Lymphocytes Absolute: 1.5 x10E3/uL (ref 0.7–3.1)
Lymphs: 19 %
MCH: 29 pg (ref 26.6–33.0)
MCHC: 32.5 g/dL (ref 31.5–35.7)
MCV: 89 fL (ref 79–97)
Monocytes Absolute: 1 x10E3/uL — ABNORMAL HIGH (ref 0.1–0.9)
Monocytes: 12 %
Neutrophils Absolute: 5.3 x10E3/uL (ref 1.4–7.0)
Neutrophils: 67 %
Platelets: 285 x10E3/uL (ref 150–450)
RBC: 5 x10E6/uL (ref 4.14–5.80)
RDW: 13.2 % (ref 11.6–15.4)
WBC: 7.9 x10E3/uL (ref 3.4–10.8)

## 2024-10-04 LAB — LIPID PANEL
Chol/HDL Ratio: 2 ratio (ref 0.0–5.0)
Cholesterol, Total: 194 mg/dL (ref 100–199)
HDL: 95 mg/dL (ref >39)
LDL Chol Calc (NIH): 88 mg/dL (ref 0–99)
Triglycerides: 57 mg/dL (ref 0–149)
VLDL Cholesterol Cal: 11 mg/dL (ref 5–40)

## 2024-10-04 LAB — PSA: Prostate Specific Ag, Serum: 0.7 ng/mL (ref 0.0–4.0)

## 2024-10-20 LAB — COLOGUARD: COLOGUARD: NEGATIVE

## 2025-02-06 ENCOUNTER — Ambulatory Visit: Admitting: Physician Assistant

## 2025-10-09 ENCOUNTER — Encounter: Admitting: Physician Assistant
